# Patient Record
Sex: Male | Born: 1960 | Race: White | Hispanic: No | Marital: Single | Smoking: Never smoker
Health system: Southern US, Community
[De-identification: ages and names within clinical notes are randomized; demographics above are authoritative.]

## PROBLEM LIST (undated history)

## (undated) DIAGNOSIS — I10 Essential (primary) hypertension: Secondary | ICD-10-CM

## (undated) DIAGNOSIS — E221 Hyperprolactinemia: Secondary | ICD-10-CM

## (undated) DIAGNOSIS — G809 Cerebral palsy, unspecified: Secondary | ICD-10-CM

## (undated) DIAGNOSIS — I639 Cerebral infarction, unspecified: Secondary | ICD-10-CM

## (undated) DIAGNOSIS — F79 Unspecified intellectual disabilities: Secondary | ICD-10-CM

## (undated) HISTORY — DX: Cerebral infarction, unspecified: I63.9

## (undated) HISTORY — PX: NO PAST SURGERIES: SHX2092

## (undated) HISTORY — DX: Hyperprolactinemia: E22.1

---

## 2016-11-13 ENCOUNTER — Encounter: Payer: Self-pay | Admitting: Gastroenterology

## 2017-01-24 ENCOUNTER — Encounter: Payer: Self-pay | Admitting: Gastroenterology

## 2017-02-25 ENCOUNTER — Ambulatory Visit (HOSPITAL_COMMUNITY)
Admission: EM | Admit: 2017-02-25 | Discharge: 2017-02-25 | Disposition: A | Discharge status: Nursing Home (Includes ICF) | Payer: Medicare Other | Attending: Emergency Medicine | Admitting: Emergency Medicine

## 2017-02-25 ENCOUNTER — Emergency Department (HOSPITAL_COMMUNITY)
Admission: EM | Admit: 2017-02-25 | Discharge: 2017-02-25 | Payer: Medicare Other | Attending: Emergency Medicine | Admitting: Emergency Medicine

## 2017-02-25 ENCOUNTER — Encounter (HOSPITAL_COMMUNITY): Payer: Self-pay

## 2017-02-25 ENCOUNTER — Encounter (HOSPITAL_COMMUNITY): Payer: Self-pay | Admitting: Emergency Medicine

## 2017-02-25 DIAGNOSIS — T2125XA Burn of second degree of buttock, initial encounter: Secondary | ICD-10-CM

## 2017-02-25 DIAGNOSIS — T3141 Burns involving 40-49% of body surface with 10-19% third degree burns: Secondary | ICD-10-CM

## 2017-02-25 DIAGNOSIS — Y999 Unspecified external cause status: Secondary | ICD-10-CM | POA: Diagnosis not present

## 2017-02-25 DIAGNOSIS — T24219A Burn of second degree of unspecified thigh, initial encounter: Secondary | ICD-10-CM

## 2017-02-25 DIAGNOSIS — Y929 Unspecified place or not applicable: Secondary | ICD-10-CM | POA: Diagnosis not present

## 2017-02-25 DIAGNOSIS — Y93E1 Activity, personal bathing and showering: Secondary | ICD-10-CM | POA: Diagnosis not present

## 2017-02-25 DIAGNOSIS — X110XXA Contact with hot water in bath or tub, initial encounter: Secondary | ICD-10-CM | POA: Diagnosis not present

## 2017-02-25 DIAGNOSIS — T2121XA Burn of second degree of chest wall, initial encounter: Secondary | ICD-10-CM | POA: Diagnosis not present

## 2017-02-25 DIAGNOSIS — T2020XA Burn of second degree of head, face, and neck, unspecified site, initial encounter: Secondary | ICD-10-CM | POA: Diagnosis not present

## 2017-02-25 DIAGNOSIS — T22212A Burn of second degree of left forearm, initial encounter: Secondary | ICD-10-CM | POA: Diagnosis not present

## 2017-02-25 DIAGNOSIS — I10 Essential (primary) hypertension: Secondary | ICD-10-CM | POA: Diagnosis not present

## 2017-02-25 DIAGNOSIS — T24212A Burn of second degree of left thigh, initial encounter: Secondary | ICD-10-CM | POA: Diagnosis not present

## 2017-02-25 DIAGNOSIS — T2220XA Burn of second degree of shoulder and upper limb, except wrist and hand, unspecified site, initial encounter: Secondary | ICD-10-CM

## 2017-02-25 HISTORY — DX: Unspecified intellectual disabilities: F79

## 2017-02-25 HISTORY — DX: Essential (primary) hypertension: I10

## 2017-02-25 HISTORY — DX: Cerebral palsy, unspecified: G80.9

## 2017-02-25 LAB — BPAM RBC
Blood Product Expiration Date: 201803292359
Blood Product Expiration Date: 201803292359
ISSUE DATE / TIME: 201803061110
ISSUE DATE / TIME: 201803061110
Unit Type and Rh: 9500
Unit Type and Rh: 9500

## 2017-02-25 LAB — BPAM FFP
Blood Product Expiration Date: 201803172359
Blood Product Expiration Date: 201803242359
ISSUE DATE / TIME: 201803061111
ISSUE DATE / TIME: 201803061111
Unit Type and Rh: 6200
Unit Type and Rh: 6200

## 2017-02-25 LAB — I-STAT CHEM 8, ED
BUN: 17 mg/dL (ref 6–20)
CALCIUM ION: 1.09 mmol/L — AB (ref 1.15–1.40)
Chloride: 104 mmol/L (ref 101–111)
Creatinine, Ser: 1 mg/dL (ref 0.61–1.24)
GLUCOSE: 109 mg/dL — AB (ref 65–99)
HCT: 43 % (ref 39.0–52.0)
Hemoglobin: 14.6 g/dL (ref 13.0–17.0)
Potassium: 4.2 mmol/L (ref 3.5–5.1)
SODIUM: 138 mmol/L (ref 135–145)
TCO2: 22 mmol/L (ref 0–100)

## 2017-02-25 LAB — PREPARE FRESH FROZEN PLASMA
Unit division: 0
Unit division: 0

## 2017-02-25 LAB — I-STAT CG4 LACTIC ACID, ED: Lactic Acid, Venous: 4.51 mmol/L (ref 0.5–1.9)

## 2017-02-25 MED ORDER — HYDROMORPHONE HCL 2 MG/ML IJ SOLN
INTRAMUSCULAR | Status: AC
  Filled 2017-02-25: qty 1

## 2017-02-25 MED ORDER — ONDANSETRON HCL 4 MG/2ML IJ SOLN
INTRAMUSCULAR | Status: AC
  Filled 2017-02-25: qty 2

## 2017-02-25 MED ORDER — HYDROMORPHONE HCL 2 MG/ML IJ SOLN
1.0000 mg | Freq: Once | INTRAMUSCULAR | Status: AC
  Administered 2017-02-25: 1 mg via INTRAVENOUS

## 2017-02-25 MED ORDER — ONDANSETRON HCL 4 MG/2ML IJ SOLN
4.0000 mg | Freq: Once | INTRAMUSCULAR | Status: AC
  Administered 2017-02-25: 4 mg via INTRAVENOUS

## 2017-02-25 MED ORDER — LACTATED RINGERS IV SOLN
INTRAVENOUS | Status: DC
  Administered 2017-02-25: 12:00:00 via INTRAVENOUS

## 2017-02-25 MED ORDER — HYDROMORPHONE HCL 1 MG/ML IJ SOLN
INTRAMUSCULAR | Status: AC
  Filled 2017-02-25: qty 1

## 2017-02-25 MED ORDER — HYDROMORPHONE HCL 1 MG/ML IJ SOLN
1.0000 mg | Freq: Once | INTRAMUSCULAR | Status: AC
  Administered 2017-02-25: 1 mg via INTRAVENOUS

## 2017-02-25 NOTE — ED Notes (Signed)
[facility] advised emergency traffic response.
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
## 2017-02-25 NOTE — Discharge Instructions (Signed)
Patient transferred via [facility] to the emergency department
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
## 2017-02-25 NOTE — ED Notes (Signed)
Spoke to MD in regards to fluid resuscitation. Only to administer 125cc/hr at this time.

## 2017-02-26 DIAGNOSIS — I10 Essential (primary) hypertension: Secondary | ICD-10-CM | POA: Insufficient documentation

## 2017-02-26 DIAGNOSIS — E440 Moderate protein-calorie malnutrition: Secondary | ICD-10-CM | POA: Diagnosis present

## 2018-02-24 ENCOUNTER — Encounter: Payer: Self-pay | Admitting: Gastroenterology

## 2018-04-01 ENCOUNTER — Other Ambulatory Visit: Payer: Self-pay

## 2018-04-06 ENCOUNTER — Ambulatory Visit: Payer: Medicare Other | Admitting: Gastroenterology

## 2018-06-05 ENCOUNTER — Ambulatory Visit: Payer: Medicare Other | Admitting: Gastroenterology

## 2018-06-12 ENCOUNTER — Emergency Department (HOSPITAL_COMMUNITY): Payer: Medicare Other

## 2018-06-12 ENCOUNTER — Encounter (HOSPITAL_COMMUNITY): Payer: Self-pay | Admitting: Emergency Medicine

## 2018-06-12 ENCOUNTER — Emergency Department (HOSPITAL_COMMUNITY)
Admission: EM | Admit: 2018-06-12 | Discharge: 2018-06-12 | Disposition: A | Discharge status: Home / Self Care | Payer: Medicare Other | Attending: Emergency Medicine | Admitting: Emergency Medicine

## 2018-06-12 DIAGNOSIS — Z79899 Other long term (current) drug therapy: Secondary | ICD-10-CM | POA: Diagnosis not present

## 2018-06-12 DIAGNOSIS — F70 Mild intellectual disabilities: Secondary | ICD-10-CM | POA: Insufficient documentation

## 2018-06-12 DIAGNOSIS — R269 Unspecified abnormalities of gait and mobility: Secondary | ICD-10-CM

## 2018-06-12 DIAGNOSIS — M549 Dorsalgia, unspecified: Secondary | ICD-10-CM | POA: Diagnosis present

## 2018-06-12 DIAGNOSIS — I10 Essential (primary) hypertension: Secondary | ICD-10-CM | POA: Diagnosis not present

## 2018-06-12 DIAGNOSIS — R2689 Other abnormalities of gait and mobility: Secondary | ICD-10-CM | POA: Diagnosis not present

## 2018-06-12 LAB — URINALYSIS, ROUTINE W REFLEX MICROSCOPIC
BILIRUBIN URINE: NEGATIVE
Glucose, UA: NEGATIVE mg/dL
Hgb urine dipstick: NEGATIVE
KETONES UR: 20 mg/dL — AB
Leukocytes, UA: NEGATIVE
NITRITE: NEGATIVE
Protein, ur: NEGATIVE mg/dL
Specific Gravity, Urine: 1.028 (ref 1.005–1.030)
pH: 5 (ref 5.0–8.0)

## 2018-06-12 LAB — CBC
HCT: 41.2 % (ref 39.0–52.0)
Hemoglobin: 13.1 g/dL (ref 13.0–17.0)
MCH: 30.5 pg (ref 26.0–34.0)
MCHC: 31.8 g/dL (ref 30.0–36.0)
MCV: 96 fL (ref 78.0–100.0)
PLATELETS: 257 10*3/uL (ref 150–400)
RBC: 4.29 MIL/uL (ref 4.22–5.81)
RDW: 12.6 % (ref 11.5–15.5)
WBC: 9.6 10*3/uL (ref 4.0–10.5)

## 2018-06-12 LAB — I-STAT TROPONIN, ED: TROPONIN I, POC: 0.01 ng/mL (ref 0.00–0.08)

## 2018-06-12 LAB — BASIC METABOLIC PANEL
Anion gap: 9 (ref 5–15)
BUN: 15 mg/dL (ref 6–20)
CALCIUM: 9.2 mg/dL (ref 8.9–10.3)
CO2: 29 mmol/L (ref 22–32)
CREATININE: 1.1 mg/dL (ref 0.61–1.24)
Chloride: 108 mmol/L (ref 101–111)
Glucose, Bld: 91 mg/dL (ref 65–99)
Potassium: 3.9 mmol/L (ref 3.5–5.1)
SODIUM: 146 mmol/L — AB (ref 135–145)

## 2018-06-12 MED ORDER — SODIUM CHLORIDE 0.9 % IV BOLUS
1000.0000 mL | Freq: Once | INTRAVENOUS | Status: AC
  Administered 2018-06-12: 1000 mL via INTRAVENOUS

## 2018-06-12 MED ORDER — LORAZEPAM 2 MG/ML IJ SOLN
1.0000 mg | Freq: Once | INTRAMUSCULAR | Status: AC | PRN
  Administered 2018-06-12: 1 mg via INTRAVENOUS
  Filled 2018-06-12: qty 1

## 2018-06-12 NOTE — ED Notes (Signed)
IV access attempted without success.

## 2018-06-12 NOTE — ED Notes (Signed)
Patient transported to CT 

## 2018-06-12 NOTE — ED Notes (Signed)
Patient transported to MRI 
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
## 2018-06-12 NOTE — ED Provider Notes (Signed)
[facility] EMERGENCY DEPARTMENT Provider Note   CSN: [id number] Arrival date & time: 06/12/18  1125     History   Chief Complaint Chief Complaint  Patient presents with  . Back Pain    HPI Marvin Mahoney is a 57 y.o. male with a history of cerebral palsy, hyperprolactinemia, hypertension, and mental retardation who presents to the emergency department accompanied by his caregiver with a chief complaint of gait change.  The patient's caregiver reports that he has been caring for the patient for the last 1.5 years.  He reports that the patient normally walks upright, but notes that he has been leaning to his right side since around 8 AM this morning.  Last known normal 9 PM last night.  The patient states that he feels a little more weak and is having some difficulty sitting up straight.  He denies back pain, shoulder pain, chest pain, shortness of breath, fever, chills, nausea, vomiting, diarrhea, dizziness, slurred speech, visual changes, headache, or worse than baseline numbness at this time.  The patient's right arm is contracted at baseline.  The patient is ambulatory at baseline.  The history is provided by the patient and a caregiver. No language interpreter was used.    Past Medical History:  Diagnosis Date  . Cerebral palsy ([place])   . Hyperprolactinemia ([place])   . Hypertension   . Mental retardation     There are no active problems to display for this patient.   No past surgical history on file.      Home Medications    Prior to Admission medications   Medication Sig Start Date End Date Taking? Authorizing Provider  PARoxetine (PAXIL) 20 MG tablet Take 60 mg by mouth at bedtime.    Yes [provider]  risperiDONE (RISPERDAL M-TABS) 2 MG disintegrating tablet Take 2 mg by mouth at bedtime.   Yes [provider]    Family History No family history on file.  Social History Social History   Tobacco Use  . Smoking status:  Unknown If Ever Smoked  Substance Use Topics  . Alcohol use: No  . Drug use: Not on file     Allergies   Patient has no known allergies.   Review of Systems Review of Systems  Constitutional: Negative for appetite change and fever.  HENT: Negative for congestion.   Respiratory: Negative for shortness of breath.   Cardiovascular: Negative for chest pain and palpitations.  Gastrointestinal: Negative for abdominal pain, diarrhea, nausea and vomiting.  Genitourinary: Negative for dysuria, hematuria and urgency.  Musculoskeletal: Positive for gait problem. Negative for back pain.  Skin: Negative for rash.  Allergic/Immunologic: Negative for immunocompromised state.  Neurological: Positive for weakness. Negative for dizziness, seizures, syncope, facial asymmetry, speech difficulty, light-headedness, numbness and headaches.  Psychiatric/Behavioral: Negative for confusion.   Physical Exam Updated Vital Signs BP 125/88   Pulse 71   Temp 98.3 F (36.8 C) (Oral)   Resp (!) 23   SpO2 98%   Physical Exam  Constitutional: He appears well-developed.  HENT:  Head: Normocephalic.  Eyes: Pupils are equal, round, and reactive to light. Conjunctivae and EOM are normal. No scleral icterus.  Neck: Neck supple.  Cardiovascular: Normal rate, regular rhythm, normal heart sounds and intact distal pulses. Exam reveals no gallop and no friction rub.  No murmur heard. Pulmonary/Chest: Effort normal. No stridor. No respiratory distress. He has no wheezes. He has no rales. He exhibits no tenderness.  Abdominal: Soft. He exhibits no  distension and no mass. There is no tenderness. There is no rebound and no guarding. No hernia.  Musculoskeletal: Normal range of motion. He exhibits no edema, tenderness or deformity.  Neurological: He is alert.  Cranial nerves II through XII are grossly intact.  Finger-to-nose is deferred at this time as the patient's right upper extremity is contracted and weak at  baseline.  He has minimal to no use of the right arm.  Good strength against resistance of the left arm.  Sensation is intact throughout.  Good strength against resistance of the bilateral lower extremities; however, strength appears to be 4 out of 5 against resistance on the right side, which the patient's caregiver reports is baseline.  Patient is ambulatory, but has a shuffling, non-symmetric gait.  The caregiver also reports that the patient's gait seems baseline.  However, as the patient is ambulating he is clearly leaning to his right side.  He is able to straighten his spine up the right, but quickly returns back to the right sided lean.  The patient's caregiver reports that this is new from baseline.  Skin: Skin is warm and dry.  Psychiatric: His behavior is normal.  Nursing note and vitals reviewed.    ED Treatments / Results  Labs (all labs ordered are listed, but only abnormal results are displayed) Labs Reviewed  BASIC METABOLIC PANEL - Abnormal; Notable for the following components:      Result Value   Sodium 146 (*)    All other components within normal limits  URINALYSIS, ROUTINE W REFLEX MICROSCOPIC - Abnormal; Notable for the following components:   Ketones, ur 20 (*)    All other components within normal limits  CBC  I-STAT TROPONIN, ED    EKG EKG Interpretation  Date/Time:  Friday June 12 2018 11:51:43 EDT Ventricular Rate:  95 PR Interval:  128 QRS Duration: 84 QT Interval:  346 QTC Calculation: 434 R Axis:   70 Text Interpretation:  Sinus rhythm with Premature supraventricular complexes Otherwise normal ECG Confirmed by Dene Gentry [phone]) on 06/12/2018 3:45:10 PM   Radiology Dg Chest 2 View  Result Date: 06/12/2018 CLINICAL DATA:  Pt arrives with caregiver, pt has hx of cerebral palsy, the caregiver states he was walking with a lean to the right today, states yesterday he was walking normally. Recent back pain. EXAM: CHEST - 2 VIEW COMPARISON:  Thoracic  spine 06/12/2018 FINDINGS: Heart size is normal. Patient is slightly rotated. The chin obscures the RIGHT lung apex. There are no focal consolidations or pleural effusions. No pulmonary edema. IMPRESSION: No evidence for acute  abnormality. Electronically Signed   By: Nolon Nations M.D.   On: 06/12/2018 12:42   Dg Thoracic Spine 2 View  Result Date: 06/12/2018 CLINICAL DATA:  Pt arrives with caregiver, pt has hx of Cerapall palsy, the caregiver states he was walking with a lean to the right today, states yesterday he was walking normally. Recent back pain. EXAM: THORACIC SPINE 2 VIEWS COMPARISON:  None. FINDINGS: There is normal alignment of the thoracic spine changes are present. No definite fractures. No lytic or blastic lesions. Posterior ribs are unremarkable. Moderate degenerative changes are seen in the LOWER thoracic levels. IMPRESSION: No evidence for acute  abnormality.  Mild degenerative changes. Electronically Signed   By: Nolon Nations M.D.   On: 06/12/2018 12:44   Dg Lumbar Spine Complete  Result Date: 06/12/2018 CLINICAL DATA:  Pt arrives with caregiver, pt has hx of Cerapall palsy, the caregiver states he was  walking with a lean to the right today, states yesterday he was walking normally. Recent back pain. EXAM: LUMBAR SPINE - COMPLETE 4+ VIEW COMPARISON:  None. FINDINGS: There is scoliosis convex to the LEFT. Associated degenerative changes are identified particularly at L2-3 and L3-4. Disc height loss noted at the same levels. There is no acute fracture or subluxation. Significant stool burden. IMPRESSION: No evidence for acute abnormality. Degenerative changes. Significant stool burden. Electronically Signed   By: Nolon Nations M.D.   On: 06/12/2018 12:45   Ct Head Wo Contrast  Result Date: 06/12/2018 CLINICAL DATA:  History of cerebral palsy.  Ataxia. EXAM: CT HEAD WITHOUT CONTRAST TECHNIQUE: Contiguous axial images were obtained from the base of the skull through the vertex  without intravenous contrast. COMPARISON:  None. FINDINGS: Brain: Large area of encephalomalacia is seen involving the left frontal, parietal and temporal lobes consistent with old infarction or other chronic injury. No mass effect or midline shift is noted. Ventricular size is within normal limits. There is no evidence of mass lesion, hemorrhage or acute infarction. Vascular: No hyperdense vessel or unexpected calcification. Skull: Normal. Negative for fracture or focal lesion. Sinuses/Orbits: No acute finding. Other: None. IMPRESSION: Large area of encephalomalacia involving the left cerebral cortex consistent with old infarction. No acute intracranial abnormality seen. Electronically Signed   By: Marijo Conception, M.D.   On: 06/12/2018 16:25   Mr Brain Wo Contrast  Result Date: 06/12/2018 CLINICAL DATA:  Abnormality, leaning toward the RIGHT. History of mental retardation, hypertension, hyperlipidemia. EXAM: MRI HEAD WITHOUT CONTRAST TECHNIQUE: Multiplanar, multiecho pulse sequences of the brain and surrounding structures were obtained without intravenous contrast. COMPARISON:  CT HEAD June 12, 2018. FINDINGS: INTRACRANIAL CONTENTS: No reduced diffusion to suggest acute ischemia. 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 Ex vacuo dilatation LEFT lateral ventricle. Moderate general parenchymal brain volume loss. Small corpus callosum is likely atrophic, less likely dysgenesis. Prominent LEFT holo hemispheric T2 bright extra-axial collections. Mildly smaller LEFT occipital lobe and LEFT cerebellum, likely developmental. Generally smaller LEFT calvarium and, smaller LEFT posterior fossa. Severe LEFT Wallerian degeneration with small LEFT cerebral peduncle and flattened LEFT medulla. A few punctate supratentorial white matter FLAIR T2 hyperintensities compatible chronic small vessel ischemic changes. No midline shift, mass effect or  masses. VASCULAR: Normal major intracranial vascular flow voids present at skull base. SKULL AND UPPER CERVICAL SPINE: No abnormal sellar expansion. No suspicious calvarial bone marrow signal. Thickened LEFT craniocervical junction maintained. SINUSES/ORBITS: The mastoid air-cells and included paranasal sinuses are well-aerated.The included ocular globes and orbital contents are non-suspicious. OTHER: None. IMPRESSION: 1. No acute intracranial process. 2. 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 3. 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 4. 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 Electronically Signed   By: Elon Alas M.D.   On: 06/12/2018 20:21    Procedures Procedures (including critical care time)  Medications Ordered in ED Medications  sodium chloride 0.9 % bolus 1,000 mL (0 mLs Intravenous Stopped 06/12/18 1949)  LORazepam (ATIVAN) injection 1 mg (1 mg Intravenous Given 06/12/18 1806)     Initial Impression / Assessment and Plan / ED Course  I have reviewed the triage vital signs and the nursing notes.  Pertinent labs & imaging results that were available during my care of the patient were reviewed by me and considered in my medical decision making (see chart  for details).     57 year old male with a history of cerebral palsy, hyperprolactinemia, hypertension, and mental retardation who presents to the emergency department accompanied by his caregiver with a chief complaint of gait change; the patient has been ambulating, but has been leaning to the right side. Last known normal 9 PM last night.  X-rays of the thoracic and lumbar spine and chest x-ray ordered by triage are unremarkable.  Minimal hypernatremia at 146.  Urinalysis is pending.  Given concern for possible new weakness, CT head was ordered which showed a  large area of encephalomalacia involving the left cerebral cortex consistent with old infarction.  No acute intracranial abnormality seen.  Patient was discussed with Dr. Francia Greaves, attending physician.  Consulted neurology and spoke with Dr. Lorraine Lax who recommended performing a UTI, giving the patient an IV fluid bolus.  He also stated that an MRI brain could be checked.  He felt that if all of these tests were normal that the patient can be discharged home.  Urinalysis with mild ketonuria, but not concerning for infection.  He was given IV fluid bolus.  MRI brain with severe left cerebrum encephalomalacia with severely wallerian degeneration and a left holohemispheric prominent extra-axial CSF space, possible arachnoid cyst and in part due to encephalomalacia.  Corpus callosum is atrophic.  Spoke with Dr. Cheral Marker, the p.m. neurologist, who reviewed the images.  No acute intracranial process.  I have spoken with the patient's caregiver who is in agreement that the patient is safe for discharge home.  I spoke with the patient's legal guardian, Barnie Alderman, earlier regarding the patient's care.   Strict return precautions given.  The patient is hemodynamically stable and in no acute distress.  He is safe for discharge home at this time.  Final Clinical Impressions(s) / ED Diagnoses   Final diagnoses:  Gait abnormality    ED Discharge Orders    None       Joanne Gavel, PA-C 06/12/18 2344    Valarie Merino, MD 06/14/18 [phone]

## 2018-06-12 NOTE — ED Notes (Signed)
MRI called to advise they were coming to transport

## 2018-06-12 NOTE — ED Triage Notes (Addendum)
Pt arrives with caregiver, pt has hx of Cerapall palsy, the caregiver states he was walking with a lean to the right today, states yesterday he was walking normally. Pt caregiver states "he says his back has been hurting him and his chest" but caregiver states it is difficult to know what's going on because he will sometimes say he is hurting everywhere. No leg drift on assessment. Unable to assess right arm. Pt has pain to palpation to his right upper back. No tenderness to abdomen or lower back on assessment.

## 2018-06-12 NOTE — ED Notes (Signed)
Caretaker: Rutherford Nail [phone]

## 2018-06-12 NOTE — Discharge Instructions (Addendum)
Thank you for allowing me to provide your care today in the emergency department.  Your MRI did not show any signs of a stroke.  The rest of your blood work is reassuring.  If you have pain in the back, you can take 650 mg of Tylenol every 6 hours for pain control.   Return to the emergency department if you become unable to walk, have recurrent falls, visual changes, slurred speech, or other new, concerning symptoms.

## 2018-06-12 NOTE — ED Notes (Signed)
IV team at bedside 

## 2018-08-11 ENCOUNTER — Encounter: Payer: Self-pay | Admitting: Gastroenterology

## 2018-08-11 ENCOUNTER — Telehealth: Payer: Self-pay | Admitting: Gastroenterology

## 2018-08-11 ENCOUNTER — Ambulatory Visit (INDEPENDENT_AMBULATORY_CARE_PROVIDER_SITE_OTHER): Payer: Medicare Other | Admitting: Gastroenterology

## 2018-08-11 VITALS — BP 100/68 | HR 88 | Ht 72.0 in | Wt 173.5 lb

## 2018-08-11 DIAGNOSIS — R195 Other fecal abnormalities: Secondary | ICD-10-CM | POA: Diagnosis not present

## 2018-08-11 DIAGNOSIS — K625 Hemorrhage of anus and rectum: Secondary | ICD-10-CM | POA: Diagnosis not present

## 2018-08-11 DIAGNOSIS — K921 Melena: Secondary | ICD-10-CM

## 2018-08-11 MED ORDER — PEG 3350-KCL-NA BICARB-NACL 420 G PO SOLR: 4000.0000 mL | ORAL | 0 refills | Status: DC

## 2018-08-11 NOTE — Telephone Encounter (Signed)
Spoke to [place] from [facility]. We have set up a phone conference on 09/24/18 at 8:30am for a verbal signature consent for 10/13/18 colonoscopy.

## 2018-08-11 NOTE — Progress Notes (Signed)
HPI: This is a very pleasant 57 year old man who was referred to me by Lillia Corporal MD, Volney Presser F-NP at [facility] complaint is fecal occult blood test positive, intermittent minor rectal bleeding  Mr. Laham is a 86 and he has cerebral palsy.  He answers some questions seemingly appropriately today but most of his history is from his long-term caregiver whom he lives with.  Also here in the room today is a representative from [place].  Care giver from is facility tells me that he was FOBT +, also overt blood in his stool very rarely.  stabel weiht.  He has occasional incontinence of stool  No pains.  FH unknown  Old Data Reviewed:    Review of systems: Pertinent positive and negative review of systems were noted in the above HPI section. All other review negative.   Past Medical History:  Diagnosis Date  . Cerebral palsy ([place])   . Hyperprolactinemia ([place])   . Hypertension   . Mental retardation   . Stroke [facility])     Past Surgical History:  Procedure Laterality Date  . NO PAST SURGERIES     unknown    Current Outpatient Medications  Medication Sig Dispense Refill  . PARoxetine (PAXIL) 20 MG tablet Take 60 mg by mouth at bedtime.     . risperiDONE (RISPERDAL M-TABS) 2 MG disintegrating tablet Take 2 mg by mouth at bedtime.     No current facility-administered medications for this visit.     Allergies as of 08/11/2018  . (No Known Allergies)    Family History  Family history unknown: Yes    Social History   Socioeconomic History  . Marital status: Single    Spouse name: Not on file  . Number of children: 0  . Years of education: Not on file  . Highest education level: Not on file  Occupational History  . Occupation: disabled  Social Needs  . Financial resource strain: Not on file  . Food insecurity:    Worry: Not on file    Inability: Not on file  . Transportation needs:    Medical: Not on file    Non-medical: Not on file  Tobacco  Use  . Smoking status: Unknown If Ever Smoked  . Smokeless tobacco: Never Used  Substance and Sexual Activity  . Alcohol use: No  . Drug use: Not on file  . Sexual activity: Not on file  Lifestyle  . Physical activity:    Days per week: Not on file    Minutes per session: Not on file  . Stress: Not on file  Relationships  . Social connections:    Talks on phone: Not on file    Gets together: Not on file    Attends religious service: Not on file    Active member of club or organization: Not on file    Attends meetings of clubs or organizations: Not on file    Relationship status: Not on file  . Intimate partner violence:    Fear of current or ex partner: Not on file    Emotionally abused: Not on file    Physically abused: Not on file    Forced sexual activity: Not on file  Other Topics Concern  . Not on file  Social History Narrative   ** Merged History Encounter **         Physical Exam: Ht 6' (1.829 m)   Wt 173 lb 8 oz (78.7 kg)   BMI  23.53 kg/m  Constitutional: Thin, right arm contracted Psychiatric: alert and oriented x1? Eyes: extraocular movements intact Mouth: oral pharynx moist, no lesions Neck: supple no lymphadenopathy Cardiovascular: heart regular rate and rhythm Lungs: clear to auscultation bilaterally Abdomen: soft, nontender, nondistended, no obvious ascites, no peritoneal signs, normal bowel sounds Extremities: no lower extremity edema bilaterally Skin: no lesions on visible extremities   Assessment and plan: 57 y.o. male with cerebral palsy, mental retardation, fecal occult blood test positive stool, minor intermittent rectal bleeding  We discussed colonoscopy to evaluate the above symptoms.  His long-term caregiver will be instructed on prep instructions.  The representative from [place] is unable to sign consent for invasive testing but his boss will be able to give consent over the phone when needed.  We may be able to get that done today.  I  recommended Citrucel fiber supplements on a daily basis to help with what sounds like minor alternating bowel habits.   Please see the "Patient Instructions" section for addition details about the plan.   Owens Loffler, MD [facility] 08/11/2018, 10:38 AM  Cc: No ref. provider found

## 2018-08-11 NOTE — Telephone Encounter (Signed)
Sherrill from [facility] states she is calling [facility] back. Best contact is [phone].

## 2018-08-11 NOTE — Patient Instructions (Addendum)
You will be set up for a colonoscopy for FOBT + stool.  Please start taking citrucel (orange flavored) powder fiber supplement.  This may cause some bloating at first but that usually goes away. Begin with a small spoonful and work your way up to a large, heaping spoonful daily over a week.  Normal BMI (Body Mass Index- based on height and weight) is between 19 and 25. Your BMI today is Body mass index is 23.53 kg/m. Marland Kitchen Please consider follow up  regarding your BMI with your Primary Care Provider.

## 2018-09-24 ENCOUNTER — Telehealth: Payer: Self-pay | Admitting: Gastroenterology

## 2018-09-24 NOTE — Telephone Encounter (Signed)
I notified Sherrill [place] Social Worker/Leagal guardian  for patient. She gave me her verbal signature for the patient as he is unable to sign for consent on 10/13/18 colonoscopy with Dr Ardis Hughs in [facility]. Benay Spice will contact Barnie Alderman who is patients case manager to make sure patients care partner still has the instructions and colon prep. Joe will contact me for any questions regarding the procedure.

## 2018-10-13 ENCOUNTER — Ambulatory Visit (AMBULATORY_SURGERY_CENTER): Payer: Medicare Other | Admitting: Gastroenterology

## 2018-10-13 ENCOUNTER — Encounter: Payer: Self-pay | Admitting: Gastroenterology

## 2018-10-13 VITALS — BP 133/75 | HR 48 | Temp 97.8°F | Resp 22 | Ht 72.0 in | Wt 173.0 lb

## 2018-10-13 DIAGNOSIS — Z1211 Encounter for screening for malignant neoplasm of colon: Secondary | ICD-10-CM | POA: Diagnosis not present

## 2018-10-13 DIAGNOSIS — D122 Benign neoplasm of ascending colon: Secondary | ICD-10-CM

## 2018-10-13 DIAGNOSIS — K921 Melena: Secondary | ICD-10-CM

## 2018-10-13 MED ORDER — SODIUM CHLORIDE 0.9 % IV SOLN: 500.0000 mL | Freq: Once | INTRAVENOUS | Status: DC

## 2018-10-13 NOTE — Op Note (Signed)
[facility] Patient Name: Marvin Mahoney Procedure Date: 10/13/2018 1:23 PM MRN: [id number] Endoscopist: Milus Banister , MD Age: 57 Referring MD:  Date of Birth: [birth year] Gender: Male Account #: [id number] Procedure:                Colonoscopy Indications:              Screening for colorectal malignant neoplasm Medicines:                Monitored Anesthesia Care Procedure:                Pre-Anesthesia Assessment:                           - Prior to the procedure, a History and Physical                            was performed, and patient medications and                            allergies were reviewed. The patient's tolerance of                            previous anesthesia was also reviewed. The risks                            and benefits of the procedure and the sedation                            options and risks were discussed with the patient.                            All questions were answered, and informed consent                            was obtained. Prior Anticoagulants: The patient has                            taken no previous anticoagulant or antiplatelet                            agents. ASA Grade Assessment: II - A patient with                            mild systemic disease. After reviewing the risks                            and benefits, the patient was deemed in                            satisfactory condition to undergo the procedure.                           After obtaining informed consent, the colonoscope  was passed under direct vision. Throughout the                            procedure, the patient's blood pressure, pulse, and                            oxygen saturations were monitored continuously. The                            Model CF-HQ190L [id number]) scope was introduced                            through the anus and advanced to the the cecum,                            identified by  appendiceal orifice and ileocecal                            valve. The colonoscopy was performed without                            difficulty. The patient tolerated the procedure                            well. The quality of the bowel preparation was                            good. The ileocecal valve, appendiceal orifice, and                            rectum were photographed. Scope In: 1:44:57 PM Scope Out: 2:01:53 PM Scope Withdrawal Time: 0 hours 12 minutes 20 seconds  Total Procedure Duration: 0 hours 16 minutes 56 seconds  Findings:                 Two sessile polyps were found in the rectum and                            ascending colon. The polyps were 2 to 3 mm in size.                            These polyps were removed with a cold snare.                            Resection and retrieval were partial.                           Many small and large-mouthed diverticula were found                            in the left colon.                           The exam was otherwise without abnormality on  direct and retroflexion views. Complications:            No immediate complications. Estimated blood loss:                            None. Estimated Blood Loss:     Estimated blood loss: none. Impression:               - Two 2 to 3 mm polyps in the rectum and in the                            ascending colon, removed with a cold snare.                            Resected and retrieved.                           - Diverticulosis in the left colon.                           - The examination was otherwise normal on direct                            and retroflexion views. Recommendation:           - Patient has a contact number available for                            emergencies. The signs and symptoms of potential                            delayed complications were discussed with the                            patient. Return to normal activities  tomorrow.                            Written discharge instructions were provided to the                            patient.                           - Resume previous diet.                           - Continue present medications.                           You will receive a letter within 2-3 weeks with the                            pathology results and my final recommendations.                           If the polyp(s) is proven to be 'pre-cancerous' on  pathology, you will need repeat colonoscopy in 5                            years. If the polyp(s) is NOT 'precancerous' on                            pathology then you should repeat colon cancer                            screening in 10 years with colonoscopy without need                            for colon cancer screening by any method prior to                            then (including stool testing). Milus Banister, MD 10/13/2018 2:06:21 PM This report has been signed electronically.

## 2018-10-13 NOTE — Patient Instructions (Signed)
Continue present medications. Please read handouts provided on Polyps and Diverticulosis.    YOU HAD AN ENDOSCOPIC PROCEDURE TODAY AT [facility]:   Refer to the procedure report that was given to you for any specific questions about what was found during the examination.  If the procedure report does not answer your questions, please call your gastroenterologist to clarify.  If you requested that your care partner not be given the details of your procedure findings, then the procedure report has been included in a sealed envelope for you to review at your convenience later.  YOU SHOULD EXPECT: Some feelings of bloating in the abdomen. Passage of more gas than usual.  Walking can help get rid of the air that was put into your GI tract during the procedure and reduce the bloating. If you had a lower endoscopy (such as a colonoscopy or flexible sigmoidoscopy) you may notice spotting of blood in your stool or on the toilet paper. If you underwent a bowel prep for your procedure, you may not have a normal bowel movement for a few days.  Please Note:  You might notice some irritation and congestion in your nose or some drainage.  This is from the oxygen used during your procedure.  There is no need for concern and it should clear up in a day or so.  SYMPTOMS TO REPORT IMMEDIATELY:   Following lower endoscopy (colonoscopy or flexible sigmoidoscopy):  Excessive amounts of blood in the stool  Significant tenderness or worsening of abdominal pains  Swelling of the abdomen that is new, acute  Fever of 100F or higher    For urgent or emergent issues, a gastroenterologist can be reached at any hour by calling [phone].   DIET:  We do recommend a small meal at first, but then you may proceed to your regular diet.  Drink plenty of fluids but you should avoid alcoholic beverages for 24 hours.  ACTIVITY:  You should plan to take it easy for the rest of today and you should NOT  DRIVE or use heavy machinery until tomorrow (because of the sedation medicines used during the test).    FOLLOW UP: Our staff will call the number listed on your records the next business day following your procedure to check on you and address any questions or concerns that you may have regarding the information given to you following your procedure. If we do not reach you, we will leave a message.  However, if you are feeling well and you are not experiencing any problems, there is no need to return our call.  We will assume that you have returned to your regular daily activities without incident.  If any biopsies were taken you will be contacted by phone or by letter within the next 1-3 weeks.  Please call us at [phone] if you have not heard about the biopsies in 3 weeks.    SIGNATURES/CONFIDENTIALITY: You and/or your care partner have signed paperwork which will be entered into your electronic medical record.  These signatures attest to the fact that that the information above on your After Visit Summary has been reviewed and is understood.  Full responsibility of the confidentiality of this discharge information lies with you and/or your care-partner.

## 2018-10-13 NOTE — Progress Notes (Signed)
Please call if any questions or concerns.Called to room to assist during endoscopic procedure.  Patient ID and intended procedure confirmed with present staff. Received instructions for my participation in the procedure from the performing physician. 

## 2018-10-13 NOTE — Progress Notes (Signed)
Spontaneous respirations throughout. VSS. Resting comfortably. To PACU on room air. Report to  RN. 

## 2018-10-14 ENCOUNTER — Telehealth: Payer: Self-pay

## 2018-10-14 NOTE — Telephone Encounter (Signed)
  Follow up Call-  Call back number 10/13/2018  Post procedure Call Back phone  # [phone]  Permission to leave phone message Yes  Some recent data might be hidden     Patient questions:  Do you have a fever, pain , or abdominal swelling? No. Pain Score  0 *  Have you tolerated food without any problems? Yes.    Have you been able to return to your normal activities? Yes.    Do you have any questions about your discharge instructions: Diet   No. Medications  No. Follow up visit  No.  Do you have questions or concerns about your Care? No.  Actions: * If pain score is 4 or above: No action needed, pain <4.

## 2018-10-18 ENCOUNTER — Encounter: Payer: Self-pay | Admitting: Gastroenterology

## 2020-09-20 ENCOUNTER — Other Ambulatory Visit: Payer: Medicare Other

## 2020-09-20 DIAGNOSIS — Z20822 Contact with and (suspected) exposure to covid-19: Secondary | ICD-10-CM

## 2020-09-21 LAB — SARS-COV-2, NAA 2 DAY TAT

## 2020-09-21 LAB — NOVEL CORONAVIRUS, NAA: SARS-CoV-2, NAA: NOT DETECTED

## 2021-03-08 DIAGNOSIS — F0390 Unspecified dementia without behavioral disturbance: Secondary | ICD-10-CM | POA: Diagnosis present

## 2021-07-13 DIAGNOSIS — F4321 Adjustment disorder with depressed mood: Secondary | ICD-10-CM | POA: Insufficient documentation

## 2021-07-13 DIAGNOSIS — G4700 Insomnia, unspecified: Secondary | ICD-10-CM | POA: Insufficient documentation

## 2021-07-13 DIAGNOSIS — F411 Generalized anxiety disorder: Secondary | ICD-10-CM | POA: Insufficient documentation

## 2021-07-28 ENCOUNTER — Observation Stay (HOSPITAL_COMMUNITY): Payer: Medicare Other

## 2021-07-28 ENCOUNTER — Emergency Department (HOSPITAL_COMMUNITY): Payer: Medicare Other

## 2021-07-28 ENCOUNTER — Inpatient Hospital Stay (HOSPITAL_COMMUNITY)
Admission: EM | Admit: 2021-07-28 | Discharge: 2021-08-04 | DRG: 522 | Disposition: A | Discharge status: Skilled Nursing Facility | Payer: Medicare Other | Attending: Student in an Organized Health Care Education/Training Program | Admitting: Student in an Organized Health Care Education/Training Program

## 2021-07-28 ENCOUNTER — Encounter (HOSPITAL_COMMUNITY): Payer: Self-pay | Admitting: Emergency Medicine

## 2021-07-28 DIAGNOSIS — W1830XA Fall on same level, unspecified, initial encounter: Secondary | ICD-10-CM | POA: Diagnosis present

## 2021-07-28 DIAGNOSIS — Z20822 Contact with and (suspected) exposure to covid-19: Secondary | ICD-10-CM | POA: Diagnosis present

## 2021-07-28 DIAGNOSIS — S72009A Fracture of unspecified part of neck of unspecified femur, initial encounter for closed fracture: Secondary | ICD-10-CM | POA: Diagnosis present

## 2021-07-28 DIAGNOSIS — M810 Age-related osteoporosis without current pathological fracture: Secondary | ICD-10-CM | POA: Diagnosis present

## 2021-07-28 DIAGNOSIS — S72141A Displaced intertrochanteric fracture of right femur, initial encounter for closed fracture: Secondary | ICD-10-CM | POA: Diagnosis not present

## 2021-07-28 DIAGNOSIS — W19XXXA Unspecified fall, initial encounter: Secondary | ICD-10-CM

## 2021-07-28 DIAGNOSIS — I10 Essential (primary) hypertension: Secondary | ICD-10-CM | POA: Diagnosis present

## 2021-07-28 DIAGNOSIS — Z23 Encounter for immunization: Secondary | ICD-10-CM

## 2021-07-28 DIAGNOSIS — Z419 Encounter for procedure for purposes other than remedying health state, unspecified: Secondary | ICD-10-CM

## 2021-07-28 DIAGNOSIS — F79 Unspecified intellectual disabilities: Secondary | ICD-10-CM | POA: Diagnosis present

## 2021-07-28 DIAGNOSIS — F0390 Unspecified dementia without behavioral disturbance: Secondary | ICD-10-CM | POA: Diagnosis present

## 2021-07-28 DIAGNOSIS — M79604 Pain in right leg: Secondary | ICD-10-CM

## 2021-07-28 DIAGNOSIS — Z87891 Personal history of nicotine dependence: Secondary | ICD-10-CM

## 2021-07-28 DIAGNOSIS — Z79899 Other long term (current) drug therapy: Secondary | ICD-10-CM

## 2021-07-28 DIAGNOSIS — F32A Depression, unspecified: Secondary | ICD-10-CM | POA: Diagnosis present

## 2021-07-28 DIAGNOSIS — S72001A Fracture of unspecified part of neck of right femur, initial encounter for closed fracture: Secondary | ICD-10-CM

## 2021-07-28 DIAGNOSIS — M25561 Pain in right knee: Secondary | ICD-10-CM | POA: Diagnosis present

## 2021-07-28 DIAGNOSIS — R2990 Unspecified symptoms and signs involving the nervous system: Secondary | ICD-10-CM

## 2021-07-28 DIAGNOSIS — I69351 Hemiplegia and hemiparesis following cerebral infarction affecting right dominant side: Secondary | ICD-10-CM

## 2021-07-28 DIAGNOSIS — R531 Weakness: Secondary | ICD-10-CM

## 2021-07-28 DIAGNOSIS — E221 Hyperprolactinemia: Secondary | ICD-10-CM | POA: Diagnosis present

## 2021-07-28 DIAGNOSIS — R29810 Facial weakness: Secondary | ICD-10-CM | POA: Diagnosis present

## 2021-07-28 DIAGNOSIS — Z6825 Body mass index (BMI) 25.0-25.9, adult: Secondary | ICD-10-CM

## 2021-07-28 DIAGNOSIS — E440 Moderate protein-calorie malnutrition: Secondary | ICD-10-CM | POA: Diagnosis present

## 2021-07-28 DIAGNOSIS — G809 Cerebral palsy, unspecified: Secondary | ICD-10-CM | POA: Diagnosis present

## 2021-07-28 DIAGNOSIS — M47812 Spondylosis without myelopathy or radiculopathy, cervical region: Secondary | ICD-10-CM | POA: Diagnosis present

## 2021-07-28 LAB — CBC WITH DIFFERENTIAL/PLATELET
Abs Immature Granulocytes: 0.04 10*3/uL (ref 0.00–0.07)
Basophils Absolute: 0 10*3/uL (ref 0.0–0.1)
Basophils Relative: 0 %
Eosinophils Absolute: 0 10*3/uL (ref 0.0–0.5)
Eosinophils Relative: 0 %
HCT: 39.2 % (ref 39.0–52.0)
Hemoglobin: 12.9 g/dL — ABNORMAL LOW (ref 13.0–17.0)
Immature Granulocytes: 0 %
Lymphocytes Relative: 4 %
Lymphs Abs: 0.4 10*3/uL — ABNORMAL LOW (ref 0.7–4.0)
MCH: 31.1 pg (ref 26.0–34.0)
MCHC: 32.9 g/dL (ref 30.0–36.0)
MCV: 94.5 fL (ref 80.0–100.0)
Monocytes Absolute: 0.9 10*3/uL (ref 0.1–1.0)
Monocytes Relative: 9 %
Neutro Abs: 8.8 10*3/uL — ABNORMAL HIGH (ref 1.7–7.7)
Neutrophils Relative %: 87 %
Platelets: 187 10*3/uL (ref 150–400)
RBC: 4.15 MIL/uL — ABNORMAL LOW (ref 4.22–5.81)
RDW: 12.8 % (ref 11.5–15.5)
WBC: 10.1 10*3/uL (ref 4.0–10.5)
nRBC: 0 % (ref 0.0–0.2)

## 2021-07-28 LAB — I-STAT CHEM 8, ED
BUN: 13 mg/dL (ref 6–20)
Calcium, Ion: 1.03 mmol/L — ABNORMAL LOW (ref 1.15–1.40)
Chloride: 108 mmol/L (ref 98–111)
Creatinine, Ser: 0.8 mg/dL (ref 0.61–1.24)
Glucose, Bld: 190 mg/dL — ABNORMAL HIGH (ref 70–99)
HCT: 38 % — ABNORMAL LOW (ref 39.0–52.0)
Hemoglobin: 12.9 g/dL — ABNORMAL LOW (ref 13.0–17.0)
Potassium: 3.5 mmol/L (ref 3.5–5.1)
Sodium: 141 mmol/L (ref 135–145)
TCO2: 23 mmol/L (ref 22–32)

## 2021-07-28 LAB — COMPREHENSIVE METABOLIC PANEL
ALT: 14 U/L (ref 0–44)
AST: 23 U/L (ref 15–41)
Albumin: 3.4 g/dL — ABNORMAL LOW (ref 3.5–5.0)
Alkaline Phosphatase: 64 U/L (ref 38–126)
Anion gap: 8 (ref 5–15)
BUN: 13 mg/dL (ref 6–20)
CO2: 23 mmol/L (ref 22–32)
Calcium: 8.7 mg/dL — ABNORMAL LOW (ref 8.9–10.3)
Chloride: 109 mmol/L (ref 98–111)
Creatinine, Ser: 0.92 mg/dL (ref 0.61–1.24)
GFR, Estimated: >60 mL/min (ref >60)
Glucose, Bld: 188 mg/dL — ABNORMAL HIGH (ref 70–99)
Potassium: 3.5 mmol/L (ref 3.5–5.1)
Sodium: 140 mmol/L (ref 135–145)
Total Bilirubin: 1 mg/dL (ref 0.3–1.2)
Total Protein: 6.2 g/dL — ABNORMAL LOW (ref 6.5–8.1)

## 2021-07-28 LAB — CBG MONITORING, ED: Glucose-Capillary: 171 mg/dL — ABNORMAL HIGH (ref 70–99)

## 2021-07-28 LAB — PROTIME-INR
INR: 1.2 (ref 0.8–1.2)
Prothrombin Time: 14.8 seconds (ref 11.4–15.2)

## 2021-07-28 LAB — APTT: aPTT: 26 seconds (ref 24–36)

## 2021-07-28 MED ORDER — PAROXETINE HCL 30 MG PO TABS
60.0000 mg | ORAL_TABLET | Freq: Every day | ORAL | Status: DC
  Administered 2021-07-28 – 2021-08-03 (×7): 60 mg via ORAL
  Filled 2021-07-28 (×8): qty 2

## 2021-07-28 MED ORDER — SENNOSIDES-DOCUSATE SODIUM 8.6-50 MG PO TABS: 1.0000 | ORAL_TABLET | Freq: Every evening | ORAL | Status: DC | PRN

## 2021-07-28 MED ORDER — SODIUM CHLORIDE 0.9% FLUSH
3.0000 mL | Freq: Once | INTRAVENOUS | Status: AC
  Administered 2021-07-28: 3 mL via INTRAVENOUS

## 2021-07-28 MED ORDER — ASPIRIN EC 81 MG PO TBEC
81.0000 mg | DELAYED_RELEASE_TABLET | Freq: Every day | ORAL | Status: DC
  Administered 2021-07-28 – 2021-08-02 (×5): 81 mg via ORAL
  Filled 2021-07-28 (×5): qty 1

## 2021-07-28 MED ORDER — ACETAMINOPHEN 650 MG RE SUPP: 650.0000 mg | Freq: Four times a day (QID) | RECTAL | Status: DC | PRN

## 2021-07-28 MED ORDER — ACETAMINOPHEN 325 MG PO TABS
650.0000 mg | ORAL_TABLET | Freq: Four times a day (QID) | ORAL | Status: DC | PRN
  Administered 2021-07-29: 650 mg via ORAL
  Filled 2021-07-28: qty 2

## 2021-07-28 MED ORDER — RIVAROXABAN 10 MG PO TABS: 10.0000 mg | ORAL_TABLET | Freq: Every day | ORAL | Status: DC

## 2021-07-28 MED ORDER — RIVAROXABAN 10 MG PO TABS
10.0000 mg | ORAL_TABLET | Freq: Every day | ORAL | Status: DC
  Administered 2021-07-28 – 2021-07-29 (×2): 10 mg via ORAL
  Filled 2021-07-28 (×2): qty 1

## 2021-07-28 MED ORDER — TAMSULOSIN HCL 0.4 MG PO CAPS
0.4000 mg | ORAL_CAPSULE | Freq: Every morning | ORAL | Status: DC
  Administered 2021-07-29 – 2021-08-03 (×3): 0.4 mg via ORAL
  Filled 2021-07-28 (×4): qty 1

## 2021-07-28 MED ORDER — RISPERIDONE 2 MG PO TBDP: 2.0000 mg | ORAL_TABLET | Freq: Every day | ORAL | Status: DC

## 2021-07-28 NOTE — ED Provider Notes (Signed)
[facility] EMERGENCY DEPARTMENT Provider Note   CSN: [id number] Arrival date & time: 07/28/21  1209     History Chief Complaint  Patient presents with   Stroke Symptoms    Marvin Mahoney is a 60 y.o. male.  The history is provided by the patient, a caregiver and medical records. No language interpreter was used.   60 year old male significant history of mental handicap, cerebral palsy, prior stroke with right-sided deficit who lives in a group home brought here with concern of stroke.  History obtained through caregiver who is at bedside.  Per caregiver, patient went to his daycare yesterday and apparently had a fall.  He was able to get up and ambulate afterward however since this morning patient unable to ambulate which concerns them.  Unable to obtain what has happened causing the fall.  Caregiver mentioned that patient got up and walk around fine and last time he was able to walk was last night.  This morning however he report he cannot pick up his right leg.  He does not complain of any neck pain back pain or leg pain.  No knee pain patient denies any significant headache neck pain chest pain trouble breathing abdominal pain back pain or pain to his extremities.  Normally he is able to ambulate without assist.  Caregiver states that this is not his norm.    Past Medical History:  Diagnosis Date   Cerebral palsy ([place])    Hyperprolactinemia ([place])    Hypertension    Mental retardation    Stroke ([place])     There are no problems to display for this patient.   Past Surgical History:  Procedure Laterality Date   NO PAST SURGERIES     unknown       Family History  Family history unknown: Yes    Social History   Tobacco Use   Smoking status: Former   Smokeless tobacco: Never  Scientific laboratory technician Use: Never used  Substance Use Topics   Alcohol use: No   Drug use: Never    Home Medications Prior to Admission medications   Medication Sig Start Date End  Date Taking? Authorizing Provider  PARoxetine (PAXIL) 20 MG tablet Take 60 mg by mouth at bedtime.     [provider]  risperiDONE (RISPERDAL M-TABS) 2 MG disintegrating tablet Take 2 mg by mouth at bedtime.    [provider]    Allergies    Patient has no known allergies.  Review of Systems   Review of Systems  All other systems reviewed and are negative.  Physical Exam Updated Vital Signs BP 129/84 (BP Location: Left Arm)   Pulse (!) 110   Temp 99.6 F (37.6 C)   Resp 18   SpO2 95%   Physical Exam Vitals and nursing note reviewed.  Constitutional:      General: He is not in acute distress.    Appearance: He is well-developed.  HENT:     Head: Normocephalic and atraumatic.  Eyes:     General: Lids are normal.     Conjunctiva/sclera: Conjunctivae normal.     Comments: Right pupil 4 mm nonreactive, left pupil 3 mm and reactive.  Extraocular movement intact.  Neck:     Comments: Neck with full range of motion no midline spine tenderness crepitus or step-off Cardiovascular:     Rate and Rhythm: Normal rate and regular rhythm.     Pulses: Normal pulses.     Heart  sounds: Normal heart sounds.  Pulmonary:     Effort: Pulmonary effort is normal.     Breath sounds: Normal breath sounds.  Abdominal:     Palpations: Abdomen is soft.     Tenderness: There is no abdominal tenderness.  Musculoskeletal:     Cervical back: Normal range of motion and neck supple. No rigidity.  Skin:    Findings: No rash.  Neurological:     Mental Status: He is alert.     GCS: GCS eye subscore is 4. GCS verbal subscore is 5. GCS motor subscore is 6.     Comments: Neurologic exam:  Speech truncated, with simple yes or no response, right pupil 4 mm and nonreactive, left pupil 3 mm and reactive.  Extraocular movements intact  No obvious facial droop Follows commands, right upper extremity in a decorticate position, chronic, unable to lift right lower leg.  Left upper and left  lower extremity with equal strength and movement. Sensation normal to light touch  Gait not tested.  Coordination not tested     ED Results / Procedures / Treatments   Labs (all labs ordered are listed, but only abnormal results are displayed) Labs Reviewed  COMPREHENSIVE METABOLIC PANEL - Abnormal; Notable for the following components:      Result Value   Glucose, Bld 188 (*)    Calcium 8.7 (*)    Total Protein 6.2 (*)    Albumin 3.4 (*)    All other components within normal limits  CBC WITH DIFFERENTIAL/PLATELET - Abnormal; Notable for the following components:   RBC 4.15 (*)    Hemoglobin 12.9 (*)    Neutro Abs 8.8 (*)    Lymphs Abs 0.4 (*)    All other components within normal limits  I-STAT CHEM 8, ED - Abnormal; Notable for the following components:   Glucose, Bld 190 (*)    Calcium, Ion 1.03 (*)    Hemoglobin 12.9 (*)    HCT 38.0 (*)    All other components within normal limits  CBG MONITORING, ED - Abnormal; Notable for the following components:   Glucose-Capillary 171 (*)    All other components within normal limits  SARS CORONAVIRUS 2 (TAT 6-24 HRS)  PROTIME-INR  APTT    EKG None  Radiology CT HEAD WO CONTRAST  Result Date: 07/28/2021 CLINICAL DATA:  Neuro deficit, acute, stroke suspected. Unwitnessed fall now with facial droop, gait abnormality, right-sided weakness. EXAM: CT HEAD WITHOUT CONTRAST TECHNIQUE: Contiguous axial images were obtained from the base of the skull through the vertex without intravenous contrast. COMPARISON:  MRI 06/12/2018 FINDINGS: Brain: Extensive encephalomalacia involving the left cerebral hemisphere in keeping with remote left ACA/MCA territory infarct is again seen. There is marked ex vacuo dilatation of the left lateral ventricle which may communicate with the extra-axial space. Alternatively, this may represent a superimposed large left arachnoid cyst as described on prior MRI examination. Moderate asymmetric parenchymal atrophy  involving the residual left occipital lobe. Evidence of wallerian degeneration involving the left thalamus and left cerebral peduncle. No evidence of acute intracranial hemorrhage or infarct. No abnormal mass effect or midline shift. Borderline ventriculomegaly involving the right lateral ventricle likely reflects the sequela of mild central atrophy and is stable since prior examination. Cerebellum unremarkable. Vascular: No asymmetric hyperdense vasculature at the skull base. Skull: Intact Sinuses/Orbits: The paranasal sinuses are clear. The orbits are unremarkable. Other: The mastoid air cells and middle ear cavities are clear. IMPRESSION: Stable changes of remote left ACA/MCA territory infarct  with extensive left cerebral encephalomalacia which may communicate with the extra-axial space or abut a large left arachnoid cyst. No associated abnormal mass effect or midline shift. No evidence of acute intracranial hemorrhage or infarct. No calvarial fracture. Electronically Signed   By: Fidela Salisbury MD   On: 07/28/2021 13:04   CT Cervical Spine Wo Contrast  Result Date: 07/28/2021 CLINICAL DATA:  Poly trauma. EXAM: CT CERVICAL SPINE WITHOUT CONTRAST TECHNIQUE: Multidetector CT imaging of the cervical spine was performed without intravenous contrast. Multiplanar CT image reconstructions were also generated. COMPARISON:  None. FINDINGS: Alignment: Normal Skull base and vertebrae: No acute fracture. No primary bone lesion or focal pathologic process. Soft tissues and spinal canal: No prevertebral fluid or swelling. No visible canal hematoma. Disc levels: Degenerative cervical spondylosis with mild multilevel disc disease and facet disease. No significant disc protrusions, spinal or foraminal stenosis. Upper chest: The lung apices are grossly clear. Other: No neck mass or hematoma. IMPRESSION: 1. Normal alignment and no acute cervical spine fracture. 2. Degenerative cervical spondylosis with mild multilevel disc  disease and facet disease. Electronically Signed   By: Marijo Sanes M.D.   On: 07/28/2021 15:00    Procedures Procedures   Medications Ordered in ED Medications  sodium chloride flush (NS) 0.9 % injection 3 mL (has no administration in time range)    ED Course  I have reviewed the triage vital signs and the nursing notes.  Pertinent labs & imaging results that were available during my care of the patient were reviewed by me and considered in my medical decision making (see chart for details).    MDM Rules/Calculators/A&P                           BP (!) 143/91   Pulse 90   Temp 99.6 F (37.6 C)   Resp 15   SpO2 98%   Final Clinical Impression(s) / ED Diagnoses Final diagnoses:  Stroke-like symptoms  Right sided weakness    Rx / DC Orders ED Discharge Orders     None      This is a 60 year old gentleman with history of cerebral palsy, prior stroke who is here due to loss of ability to ambulate since this morning.  Last known normal was yesterday however patient did had a reported fall but was able to ambulate afterward without difficulty and did not complain of any significant injury from the fall.  No reproducible pain on exam.  Patient does have right-sided weakness however right lower extremity increased weakness is new.  He is unable to ambulate therefore will need to be admitted for further evaluation including a stroke work-up.  Initial head CT scan without acute intracranial abnormality labs are baseline.  Care discussed with Dr. Tomi Bamberger.   3:34 PM Cervical spine CT unremarkable.  Brain MRI have been ordered.  Appreciate consultation from [facility] hospitalist, Dr. Rogers Blocker, who agrees to admit patient for stroke work-up.   Domenic Moras, PA-C 07/28/21 1540    Dorie Rank, MD 07/28/21 [phone]

## 2021-07-28 NOTE — H&P (Addendum)
Date: 07/28/2021               Patient Name:  Georgios Solla MRN: [id number]  DOB: December 14, 1961 Age / Sex: 60 y.o., male   PCP: Pcp, No         Medical Service: Internal Medicine Teaching Service         Attending Physician: Dr. Evette Doffing    First Contact: Dr. Raymondo Band Pager: [phone]  Second Contact: Dr. Marva Panda Pager: [phone]       After Hours (After 5p/  First Contact Pager: [phone]  weekends / holidays): Second Contact Pager: [phone]   Chief Complaint: new right leg weakness  History of Present Illness: Mr. Cleo Byron is a 60 yo male with a PMHx of HTN, cerebral palsy, prior stroke with right sided deficits (unknown date, >5 years ago), depression, and mental retardation, who presents from his group home for concern of stroke. The history is mostly provided by the patient's caregiver, [name]. [name] notes that Mr. Reo had a fall yesterday while at his daycare facility. Mr. Guadagnoli was able to tell me that he tripped while trying to get up out of his chair. He was able to get up on his own and ambulate afterwards and was fine the rest of the evening. This morning, [name] went to check on Mr. Glaeser and noted that the right side of his face seemed to have a droop and that he was unable to walk anymore because of new right leg weakness. The patient does have right sided weakness at his baseline because of a prior stroke, however, he was able to ambulate on his own prior to this morning. Last known well was yesterday evening, around 8pm. The caregiver also notes that Mr. Due did not eat much of his breakfast this morning, which is unusual for him. He took the patient to an urgent care facility where they encourage the patient to come to the ED for a stroke workup. Patient denies any fevers, chills, headache, dizziness, chest pain, SOB, abd pain, n/v/d, back pain, dysuria, or any other symptoms at this time. He does note that he is now having pain in his right knee.   In the ED, CT head performed and showed no  evidence of acute intracranial hemorrhage or infarct. CT cervical spine with no evidence of acute fractures, but degenerative cervical spondylosis with mild multilevel disc disease and facet disease noted. CXR negative for any acute process.   Meds: confirmed by caregiver Current Meds  Medication Sig   PARoxetine (PAXIL) 20 MG tablet Take 60 mg by mouth at bedtime.    risperiDONE (RISPERDAL) 2 MG tablet Take 2 mg by mouth at bedtime.   tamsulosin (FLOMAX) 0.4 MG CAPS capsule Take 0.4 mg by mouth in the morning.     Allergies: Allergies as of 07/28/2021   (No Known Allergies)   Past Medical History:  Diagnosis Date   Cerebral palsy ([place])    Hyperprolactinemia ([place])    Hypertension    Mental retardation    Stroke [facility]     Family History: no history noted, both of patient's parents are deceased and he has a brother who he has not been in contact with for many years  Social History: former smoker although neither the patient nor the caregiver can elaborate, no alcohol use, no other illicit drug use. Patient resides at [facility] in [place] and has lived there for the past 6 years with his primary caregiver, [name].  Review  of Systems: A complete ROS was negative except as per HPI.   Physical Exam: Blood pressure (!) 143/91, pulse 90, temperature 99.6 F (37.6 C), resp. rate 15, SpO2 98 %. Physical Exam HENT:     Mouth/Throat:     Mouth: Mucous membranes are dry.  Eyes:     Extraocular Movements: Extraocular movements intact.     Comments: Right pupil ~84m, nonreactive. Left pupil 2-374m reactive  Cardiovascular:     Rate and Rhythm: Regular rhythm. Tachycardia present.     Pulses: Normal pulses.     Heart sounds: No murmur heard. Pulmonary:     Effort: No respiratory distress.     Breath sounds: No wheezing, rhonchi or rales.  Abdominal:     General: Bowel sounds are normal.     Palpations: Abdomen is soft.     Tenderness: There is no abdominal tenderness.   Musculoskeletal:        General: Normal range of motion.     Right lower leg: No edema.     Left lower leg: No edema.  Skin:    General: Skin is warm and dry.  Neurological:     Cranial Nerves: Cranial nerve deficit present.     Sensory: No sensory deficit.     Motor: Weakness present.     Comments: CN XI weakness: unable to shrug shoulder on the right Unable to lift right arm (chronic) and now unable to move/lift right leg (acute)      EKG: personally reviewed my interpretation is sinus tachycardia at 113bpm.  CXR: personally reviewed my interpretation is: negative for any acute process  Assessment & Plan by Problem: Active Problems:   * No active hospital problems. *  #Right sided weakness Patient has a history of a prior stroke >6 years ago, with right sided residual deficits. Unknown when this stroke occurred, however, the patient is not on any medical therapy for hypertension, cholesterol, or any blood thinners. Last known well time was yesterday evening around 8pm and as of this morning, the patient is unable to walk due to new right leg weakness and was reported to have a right sided facial droop per his caregiver. CT head showed stable changes of the left ACA/MCA territory infarct and no evidence of acute intracranial hemorrhage/infarct.  - MRI brain pending - CBC, BMP, lipid panel, and A1c pending - PT/OT eval and treat  #Right knee pain Patient had a mechanical fall yesterday afternoon and has had right sided knee pain since then. He was able to get up and walk after the fall. - Right knee xray pending  #Depression Continue home medications, risperidone and paroxetine.    Dispo: Admit patient to Inpatient with expected length of stay greater than 2 midnights.  Caregiver: ChHarrie [phone]: AtDorethea ClanDO 07/28/2021, 3:17 PM  Pager: '@[phone]'$ @ After 5pm on weekdays and 1pm on weekends: On Call pager: [phone]

## 2021-07-28 NOTE — ED Notes (Signed)
Charles, pt caregiver, would like updates when available. Number is in chart.

## 2021-07-28 NOTE — ED Triage Notes (Signed)
Pt from a group home.  Reports fall yesterday.  Woke up with facial droop, difficulty ambulating, and R sided leg weakness this morning.  History of stroke with R sided deficits.

## 2021-07-28 NOTE — ED Notes (Signed)
Pt back from MRI 

## 2021-07-28 NOTE — ED Notes (Signed)
Pt eating dinner at this time

## 2021-07-28 NOTE — ED Notes (Signed)
Patient transported to MRI 

## 2021-07-28 NOTE — ED Notes (Signed)
Tele

## 2021-07-28 NOTE — ED Notes (Signed)
Barnie Alderman, legal guardian, [phone] would like an update when available

## 2021-07-28 NOTE — ED Notes (Signed)
Per Dr. Laural Golden okay to DC [facility] orders and do q4 neuro checks

## 2021-07-29 ENCOUNTER — Observation Stay (HOSPITAL_COMMUNITY): Payer: Medicare Other

## 2021-07-29 DIAGNOSIS — R29810 Facial weakness: Secondary | ICD-10-CM | POA: Diagnosis present

## 2021-07-29 DIAGNOSIS — E440 Moderate protein-calorie malnutrition: Secondary | ICD-10-CM | POA: Diagnosis present

## 2021-07-29 DIAGNOSIS — G809 Cerebral palsy, unspecified: Secondary | ICD-10-CM | POA: Diagnosis present

## 2021-07-29 DIAGNOSIS — M810 Age-related osteoporosis without current pathological fracture: Secondary | ICD-10-CM | POA: Diagnosis present

## 2021-07-29 DIAGNOSIS — E221 Hyperprolactinemia: Secondary | ICD-10-CM | POA: Diagnosis present

## 2021-07-29 DIAGNOSIS — S72001A Fracture of unspecified part of neck of right femur, initial encounter for closed fracture: Secondary | ICD-10-CM | POA: Diagnosis not present

## 2021-07-29 DIAGNOSIS — S72009A Fracture of unspecified part of neck of unspecified femur, initial encounter for closed fracture: Secondary | ICD-10-CM | POA: Diagnosis present

## 2021-07-29 DIAGNOSIS — I10 Essential (primary) hypertension: Secondary | ICD-10-CM | POA: Diagnosis present

## 2021-07-29 DIAGNOSIS — S72141A Displaced intertrochanteric fracture of right femur, initial encounter for closed fracture: Secondary | ICD-10-CM | POA: Diagnosis present

## 2021-07-29 DIAGNOSIS — M25561 Pain in right knee: Secondary | ICD-10-CM | POA: Diagnosis present

## 2021-07-29 DIAGNOSIS — M47812 Spondylosis without myelopathy or radiculopathy, cervical region: Secondary | ICD-10-CM | POA: Diagnosis present

## 2021-07-29 DIAGNOSIS — Z79899 Other long term (current) drug therapy: Secondary | ICD-10-CM | POA: Diagnosis not present

## 2021-07-29 DIAGNOSIS — F79 Unspecified intellectual disabilities: Secondary | ICD-10-CM | POA: Diagnosis present

## 2021-07-29 DIAGNOSIS — Z23 Encounter for immunization: Secondary | ICD-10-CM | POA: Diagnosis not present

## 2021-07-29 DIAGNOSIS — Z87891 Personal history of nicotine dependence: Secondary | ICD-10-CM | POA: Diagnosis not present

## 2021-07-29 DIAGNOSIS — W1830XA Fall on same level, unspecified, initial encounter: Secondary | ICD-10-CM | POA: Diagnosis present

## 2021-07-29 DIAGNOSIS — F32A Depression, unspecified: Secondary | ICD-10-CM | POA: Diagnosis present

## 2021-07-29 DIAGNOSIS — I69351 Hemiplegia and hemiparesis following cerebral infarction affecting right dominant side: Secondary | ICD-10-CM | POA: Diagnosis not present

## 2021-07-29 DIAGNOSIS — W19XXXA Unspecified fall, initial encounter: Secondary | ICD-10-CM | POA: Diagnosis not present

## 2021-07-29 DIAGNOSIS — Z20822 Contact with and (suspected) exposure to covid-19: Secondary | ICD-10-CM | POA: Diagnosis present

## 2021-07-29 DIAGNOSIS — Z6825 Body mass index (BMI) 25.0-25.9, adult: Secondary | ICD-10-CM | POA: Diagnosis not present

## 2021-07-29 DIAGNOSIS — F0390 Unspecified dementia without behavioral disturbance: Secondary | ICD-10-CM | POA: Diagnosis present

## 2021-07-29 LAB — BASIC METABOLIC PANEL
Anion gap: 7 (ref 5–15)
BUN: 11 mg/dL (ref 6–20)
CO2: 26 mmol/L (ref 22–32)
Calcium: 8.4 mg/dL — ABNORMAL LOW (ref 8.9–10.3)
Chloride: 103 mmol/L (ref 98–111)
Creatinine, Ser: 0.77 mg/dL (ref 0.61–1.24)
GFR, Estimated: >60 mL/min (ref >60)
Glucose, Bld: 115 mg/dL — ABNORMAL HIGH (ref 70–99)
Potassium: 3.7 mmol/L (ref 3.5–5.1)
Sodium: 136 mmol/L (ref 135–145)

## 2021-07-29 LAB — CBC
HCT: 37.1 % — ABNORMAL LOW (ref 39.0–52.0)
Hemoglobin: 12.3 g/dL — ABNORMAL LOW (ref 13.0–17.0)
MCH: 31.1 pg (ref 26.0–34.0)
MCHC: 33.2 g/dL (ref 30.0–36.0)
MCV: 93.9 fL (ref 80.0–100.0)
Platelets: 179 10*3/uL (ref 150–400)
RBC: 3.95 MIL/uL — ABNORMAL LOW (ref 4.22–5.81)
RDW: 12.5 % (ref 11.5–15.5)
WBC: 10.1 10*3/uL (ref 4.0–10.5)
nRBC: 0 % (ref 0.0–0.2)

## 2021-07-29 LAB — HEMOGLOBIN A1C
Hgb A1c MFr Bld: 5.1 % (ref 4.8–5.6)
Mean Plasma Glucose: 99.67 mg/dL

## 2021-07-29 LAB — HIV ANTIBODY (ROUTINE TESTING W REFLEX): HIV Screen 4th Generation wRfx: NONREACTIVE

## 2021-07-29 LAB — SARS CORONAVIRUS 2 (TAT 6-24 HRS): SARS Coronavirus 2: NEGATIVE

## 2021-07-29 LAB — LIPID PANEL
Cholesterol: 142 mg/dL (ref 0–200)
HDL: 50 mg/dL (ref >40)
LDL Cholesterol: 81 mg/dL (ref 0–99)
Total CHOL/HDL Ratio: 2.8 RATIO
Triglycerides: 55 mg/dL (ref <150)
VLDL: 11 mg/dL (ref 0–40)

## 2021-07-29 MED ORDER — HYDROMORPHONE HCL 1 MG/ML IJ SOLN
0.5000 mg | INTRAMUSCULAR | Status: DC | PRN
  Administered 2021-07-29 – 2021-07-31 (×5): 0.5 mg via INTRAVENOUS
  Filled 2021-07-29 (×5): qty 1

## 2021-07-29 NOTE — Progress Notes (Addendum)
HD#1 SUBJECTIVE:  Patient Summary: Marvin Mahoney is a 60 y.o. with a pertinent PMH of HTN, cerebral palsy, prior stroke with residual right sided deficits, depression, and cognitive impairment who presented with new onset right sided weakness and admitted for weakness.   Overnight Events: No acute events overnight.  Interim History: This is hospital day 1 for Marvin Mahoney who was seen and evaluated at the bedside this morning. He continues to endorse right leg pain, most notably in his knee and thigh region. He denies pain anywhere else. R hip xray ordered.  OBJECTIVE:  Vital Signs: Vitals:   07/29/21 1000 07/29/21 1015 07/29/21 1030 07/29/21 1123  BP: (!) 141/95 (!) 139/98 (!) 160/107 (!) 148/87  Pulse: 63 63 81 78  Resp: (!) 22 (!) '21 16 20  '$ Temp:    98.9 F (37.2 C)  TempSrc:    Oral  SpO2: 98% 99% 97% 99%  Weight:      Height:       Supplemental O2: Nasal Cannula SpO2: 99 % O2 Flow Rate (L/min): 2 L/min FiO2 (%): 100 %  Filed Weights   07/28/21 1603  Weight: 78.5 kg    No intake or output data in the 24 hours ending 07/29/21 1210 Net IO Since Admission: No IO data has been entered for this period [07/29/21 1210]  Physical Exam: General: Chronically ill-appearing male laying in bed. No acute distress but appears to be in pain. CV: RRR. No murmurs, rubs, or gallops. No LE edema Pulmonary: Lungs CTAB. Normal effort. No wheezing or rales. Abdominal: Soft, nontender, nondistended. Normal bowel sounds. Extremities: Palpable radial and DP pulses. Limited internal rotation of R lower extremity. R lower extremity is shortened and also atrophied compared to left. R arm contracture noted.  Skin: Warm and dry. No obvious rash or lesions. Neuro: A&O to self and place. Normal sensation. Limited shoulder shrug on R, unable to lift/move right upper extremity (which is chronic) and R lower extremity (new).  Psych: Appropriate mood and affect   Patient Lines/Drains/Airways Status      Active Line/Drains/Airways     Name Placement date Placement time Site Days   Peripheral IV 06/12/18 Left;Posterior Hand 06/12/18  1728  Hand  1143   External Urinary Catheter 06/12/18  1715  --  1143            Pertinent Labs: CBC Latest Ref Rng & Units 07/29/2021 07/28/2021 07/28/2021  WBC 4.0 - 10.5 K/uL 10.1 10.1 -  Hemoglobin 13.0 - 17.0 g/dL 12.3(L) 12.9(L) 12.9(L)  Hematocrit 39.0 - 52.0 % 37.1(L) 39.2 38.0(L)  Platelets 150 - 400 K/uL 179 187 -    CMP Latest Ref Rng & Units 07/29/2021 07/28/2021 07/28/2021  Glucose 70 - 99 mg/dL 115(H) 190(H) 188(H)  BUN 6 - 20 mg/dL '11 13 13  '$ Creatinine 0.61 - 1.24 mg/dL 0.77 0.80 0.92  Sodium 135 - 145 mmol/L 136 141 140  Potassium 3.5 - 5.1 mmol/L 3.7 3.5 3.5  Chloride 98 - 111 mmol/L 103 108 109  CO2 22 - 32 mmol/L 26 - 23  Calcium 8.9 - 10.3 mg/dL 8.4(L) - 8.7(L)  Total Protein 6.5 - 8.1 g/dL - - 6.2(L)  Total Bilirubin 0.3 - 1.2 mg/dL - - 1.0  Alkaline Phos 38 - 126 U/L - - 64  AST 15 - 41 U/L - - 23  ALT 0 - 44 U/L - - 14    Recent Labs    07/28/21 1224  GLUCAP 171*  ASSESSMENT/PLAN:  Assessment: Principal Problem:   Fracture of femoral neck, right (HCC) Active Problems:   CP (cerebral palsy) (HCC)   Dementia (HCC)   Moderate protein-calorie malnutrition (HCC)   Intertrochanteric fracture of right hip (HCC)   Plan: #Right femoral neck fracture Patient has a history of a prior stroke >6 years ago, with right sided residual deficits. The patient sustained a fall on 8/5 and as of the morning of 8/6, the patient is unable to walk due to new right leg weakness. CT head showed stable changes of the left ACA/MCA territory infarct and no evidence of acute intracranial hemorrhage/infarct. MRI brain negative for new infarct as well. R knee xray performed with no acute osseous abnormality noted, however, R hip xray performed and showed a displaced and slightly impacted basicervical fracture of the right femoral neck that is  subacute to chronic appearing.  - Orthopedics consulted, appreciate recommendations - Pain management with dilaudid 0.'5mg'$  q4 hours PRN  - PT/OT eval and treat  #Depression Continue home medications, risperidone and paroxetine.   Best Practice: Diet: NPO pending ortho recs IVF: Fluids: none VTE: rivaroxaban (XARELTO) tablet 10 mg Start: 07/28/21 2115 (received today, discontinued until ortho recs) Code: Full Therapy Recs: SNF Contact: Legal Ronda: [phone] or [name] (caregiver) [phone] DISPO: Anticipated discharge to [facility] facility pending clinical improvement and ortho recs  Signature: Brecklyn Galvis, D.O.  Internal Medicine Resident, PGY-1 [facility]  Pager: [phone] 12:10 PM, 07/29/2021   Please contact the on call pager after 5 pm and on weekends at [phone].

## 2021-07-29 NOTE — ED Notes (Signed)
Pt alert, NAD, calm, interactive, oriented to person and place. Does not know season, month, age, year, city or state. Denies sx or complaints, but admits to some nausea and some sob. Denies pain.

## 2021-07-29 NOTE — ED Notes (Signed)
Dr. Laural Golden notified of pt BP

## 2021-07-29 NOTE — ED Notes (Signed)
Pt brief wet. Pt changed into new brief and new gown, given blanket. Pt endorses pain in right leg

## 2021-07-29 NOTE — ED Notes (Signed)
PT/OT at [facility]

## 2021-07-29 NOTE — Evaluation (Signed)
Occupational Therapy Evaluation Patient Details Name: Marvin Mahoney MRN: [id number] DOB: May 19, 1961 Today's Date: 07/29/2021    History of Present Illness Pt is a 60 y.o. male who presented 07/28/21 s/p fall at his daycare facility and noted increased R-sided weakness. CT and MRI negative for acute intracranial processes. Imaging of R knee negative for acute osseous abnormalities. Chronic appearing RIGHT femoral neck fracture with superolateral displacement of at least 1.5 cm. PMHx of HTN, cerebral palsy, prior stroke with right sided deficits (unknown date, >5 years ago), HTN, depression, and mental retardation   Clinical Impression   This 60 yo male admitted with above presents to acute OT with PLOF per Marvin Mahoney (his caregiver) as being able to toilet himself, walk unassisted and without AD, bath and dress himself with setup/intermittent S, ambulate up and down steps with use of one hand on hand rail (18 steps),and goes to day program during the weak. Currently pt is complaining of pain in right knee with any movement, not wanting to put any weight on it when attempting to stand, needing +2 for all mobility, and reporting he needed more A home pta than he actually did. He will continue to benefit from acute OT with follow up at SNF currently recommended. We will continue to follow.    Follow Up Recommendations  SNF;Supervision/Assistance - 24 hour    Equipment Recommendations  Other (comment) (TBD next venue)           Precautions / Restrictions Precautions Precautions: Fall Restrictions Weight Bearing Restrictions: No      Mobility Bed Mobility Overal bed mobility: Needs Assistance Bed Mobility: Supine to Sit;Sit to Supine     Supine to sit: Max assist;+2 for physical assistance;HOB elevated Sit to supine: Mod assist;+2 for physical assistance   General bed mobility comments: Pt unable to attempt roll to side, thus cued pt to manage legs off R EOB. MaxAx2 to manage legs off bed and  elevate trunk to sit up and square hips with EOB. ModAx2 to manage legs back onto bed due to pain and to control trunk descent.    Transfers Overall transfer level: Needs assistance Equipment used: 2 person hand held assist Transfers: Sit to/from Stand Sit to Stand: Mod assist;+2 physical assistance         General transfer comment: ModAx2 to power up to stand with UE support on therapists, x2 reps from EOB. Pt tends to bring his L foot posteriorly, placing weight through his forefoot while bringing his R foot anterior and slightly hovering it off the ground, likely due to pain.    Balance Overall balance assessment: Needs assistance Sitting-balance support: Single extremity supported;No upper extremity supported;Feet supported Sitting balance-Leahy Scale: Poor Sitting balance - Comments: Pt with posterior and R lateral lean, needing min-modA majority of time to maintain balance, intermittently briefly being able to hold his balance with min guard assist. Postural control: Posterior lean;Right lateral lean Standing balance support: Bilateral upper extremity supported Standing balance-Leahy Scale: Poor Standing balance comment: ModAx2 and UE support to stand statically x2 bouts of ~30 sec each, with pt lifting his R foot off the ground likely due to pain.                           ADL either performed or assessed with clinical judgement   ADL Overall ADL's : Needs assistance/impaired Eating/Feeding: Set up;Bed level   Grooming: Set up;Bed level   Upper Body Bathing: Minimal assistance;Sitting  Lower Body Bathing: Total assistance Lower Body Bathing Details (indicate cue type and reason): Max A +2 sit<>stand Upper Body Dressing : Minimal assistance;Sitting   Lower Body Dressing: Total assistance Lower Body Dressing Details (indicate cue type and reason): Max A +2 sit<>stand   Toilet Transfer Details (indicate cue type and reason): unable at this time due to pain in  right knee (and pt would not WB through right knee) Toileting- Clothing Manipulation and Hygiene: Total assistance Toileting - Clothing Manipulation Details (indicate cue type and reason): Max A +2 sit<>stand             Vision Patient Visual Report: No change from baseline              Pertinent Vitals/Pain Pain Assessment: Faces Faces Pain Scale: Hurts whole lot Pain Location: R knee Pain Descriptors / Indicators: Discomfort;Grimacing;Guarding;Moaning Pain Intervention(s): Limited activity within patient's tolerance;Monitored during session;Repositioned     Hand Dominance Left   Extremity/Trunk Assessment Upper Extremity Assessment Upper Extremity Assessment: RUE deficits/detail RUE Deficits / Details: CP--non functional UE with flexion tone   Lower Extremity Assessment Lower Extremity Assessment: RLE deficits/detail;LLE deficits/detail;Generalized weakness RLE Deficits / Details: Actively flexes knee sitting EOB and extends knee to push leg anteriorly and hover foot with standing, unable to follow cues for formal MMT and due to pain LLE Deficits / Details: Unable to follow cues for formal MMT but displays WFL AROM with functional mobility   Cervical / Trunk Assessment Cervical / Trunk Assessment: Kyphotic   Communication Communication Communication: No difficulties   Cognition Arousal/Alertness: Awake/alert Behavior During Therapy: Flat affect Overall Cognitive Status: History of cognitive impairments - at baseline                                 General Comments: Pt with diagnosis of cerebral palsy and mental retardation at baseline. Pt with difficulty identifying L from R, needing cues to point out where the pain is. Difficulty following commands to perform MMT. Did not give [place] correct information as to PLOF (this was noted after talking to Marvin Mahoney his caregiver).   General Comments  VSS on RA but pt reporting dizziness sitting EOB             Home Living Family/patient expects to be discharged to:: Skilled nursing facility                                 Additional Comments: [place] with 18-20 stairs with bil rails to get to his bedroom upstairs. Tub/shower combo with seat available. Goes to daycare during day.      Prior Functioning/Environment Level of Independence: Needs assistance  Gait / Transfers Assistance Needed: Caregiver at group home reports pt is capable of ambulating and negotiating stairs using the rail independently without AD/AE. ADL's / Homemaking Assistance Needed: Has tub/shower combo and has a seat, but does not use seat. Needs assistance with set-up for bathing and dressing only. Capable of toileting himself. Chuck manages pt's meds and meals. Communication / Swallowing Assistance Needed: Reports he feeds himself. Comments: Does not have w/c.        OT Problem List: Decreased strength;Decreased range of motion;Decreased activity tolerance;Impaired balance (sitting and/or standing);Decreased coordination;Decreased cognition;Decreased safety awareness;Impaired UE functional use;Impaired tone;Pain      OT Treatment/Interventions: Self-care/ADL training;DME and/or AE instruction;Patient/family education;Balance training;Therapeutic activities    OT  Goals(Current goals can be found in the care plan section) Acute Rehab OT Goals Patient Stated Goal: to eat OT Goal Formulation: With patient Time For Goal Achievement: 08/12/21 Potential to Achieve Goals: Good  OT Frequency: Min 2X/week       Co-evaluation PT/OT/SLP Co-Evaluation/Treatment: Yes Reason for Co-Treatment: Complexity of the patient's impairments (multi-system involvement);Necessary to address cognition/behavior during functional activity;For patient/therapist safety;To address functional/ADL transfers PT goals addressed during session: Mobility/safety with mobility;Balance OT goals addressed during session: ADL's and  self-care;Strengthening/ROM      AM-PAC OT "6 Clicks" Daily Activity     Outcome Measure Help from another person eating meals?: A Little Help from another person taking care of personal grooming?: A Little Help from another person toileting, which includes using toliet, bedpan, or urinal?: A Lot Help from another person bathing (including washing, rinsing, drying)?: A Lot Help from another person to put on and taking off regular upper body clothing?: A Little Help from another person to put on and taking off regular lower body clothing?: Total 6 Click Score: 14   End of Session Equipment Utilized During Treatment: Gait belt Nurse Communication: Mobility status  Activity Tolerance: Patient limited by pain Patient left: in bed;with call bell/phone within reach  OT Visit Diagnosis: Unsteadiness on feet (R26.81);Other abnormalities of gait and mobility (R26.89);Muscle weakness (generalized) (M62.81);Pain Pain - Right/Left: Right Pain - part of body: Knee                Time: [id number] OT Time Calculation (min): 19 min Charges:  OT General Charges $OT Visit: 1 Visit OT Evaluation $OT Eval Moderate Complexity: [place], OTR/L Acute [employer] Pager [phone] Office [phone]    Almon Register 07/29/2021, 12:50 PM

## 2021-07-29 NOTE — Progress Notes (Signed)
Physical Therapy Evaluation Patient Details Name: Marvin Mahoney MRN: [id number] DOB: [birth year] Today's Date: 07/29/2021   History of Present Illness  Pt is a 60 y.o. male who presented 07/28/21 s/p fall at his daycare facility and noted increased R-sided weakness. CT and MRI negative for acute intracranial processes. Imaging of R knee negative for acute osseous abnormalities. Chronic appearing RIGHT femoral neck fracture with superolateral displacement of at least 1.5 cm. PMHx of HTN, cerebral palsy, prior stroke with right sided deficits (unknown date, >5 years ago), HTN, depression, and mental retardation   Clinical Impression  Pt presents with condition above and deficits mentioned below, see PT Problem List. PTA, he was living at a group home with a level entry but there were 18-20 stairs with rails to get to his bedroom upstairs. PTA, he was walking without an AD and navigating stairs using the rails without assistance. He needs assistance to set-up for bathing and dressing. Currently, pt is exhibiting severe R knee pain limiting his ability to bear weight on it, needing modAx2 to transfer to stand with UE support. He was unable to tolerate taking steps today. He displays generalized weakness, balance deficits, and decreased activity tolerance. Pt is at high risk for falls. Recommending pt follow-up with skilled PT for short-term rehab at a SNF to maximize his safety and independence with all functional mobility prior to return to his group home. Will continue to follow acutely.    Follow Up Recommendations SNF;Supervision/Assistance - 24 hour    Equipment Recommendations  Rolling walker with 5" wheels;3in1 (PT)    Recommendations for Other Services       Precautions / Restrictions Precautions Precautions: Fall Restrictions Weight Bearing Restrictions: No      Mobility  Bed Mobility Overal bed mobility: Needs Assistance Bed Mobility: Supine to Sit;Sit to Supine     Supine to sit: Max  assist;+2 for physical assistance;HOB elevated Sit to supine: Mod assist;+2 for physical assistance   General bed mobility comments: Pt unable to attempt roll to side, thus cued pt to manage legs off R EOB. MaxAx2 to manage legs off bed and elevate trunk to sit up and square hips with EOB. ModAx2 to manage legs back onto bed due to pain and to control trunk descent.    Transfers Overall transfer level: Needs assistance Equipment used: 2 person hand held assist Transfers: Sit to/from Stand Sit to Stand: Mod assist;+2 physical assistance         General transfer comment: ModAx2 to power up to stand with UE support on therapists, x2 reps from EOB. Pt tends to bring his L foot posteriorly, placing weight through his forefoot while bringing his R foot anterior and slightly hovering it off the ground, likely due to pain.  Ambulation/Gait             General Gait Details: Unable.  Stairs            Wheelchair Mobility    Modified Rankin (Stroke Patients Only) Modified Rankin (Stroke Patients Only) Pre-Morbid Rankin Score: Severe disability Modified Rankin: Severe disability     Balance Overall balance assessment: Needs assistance Sitting-balance support: Single extremity supported;No upper extremity supported;Feet supported Sitting balance-Leahy Scale: Poor Sitting balance - Comments: Pt with posterior and R lateral lean, needing min-modA majority of time to maintain balance, intermittently briefly being able to hold his balance with min guard assist. Postural control: Posterior lean;Right lateral lean Standing balance support: Bilateral upper extremity supported Standing balance-Leahy Scale: Poor Standing balance comment:  ModAx2 and UE support to stand statically x2 bouts of ~30 sec each, with pt lifting his R foot off the ground likely due to pain.                             Pertinent Vitals/Pain Pain Assessment: Faces Faces Pain Scale: Hurts whole  lot Pain Location: R knee Pain Descriptors / Indicators: Discomfort;Grimacing;Guarding;Moaning Pain Intervention(s): Limited activity within patient's tolerance;Monitored during session;Repositioned    Home Living Family/patient expects to be discharged to:: Group home                 Additional Comments: [place] with 18-20 stairs with bil rails to get to his bedroom upstairs. Tub/shower combo with seat available. Goes to daycare during day.    Prior Function Level of Independence: Needs assistance   Gait / Transfers Assistance Needed: Caregiver at group home reports pt is capable of ambulating and negotiating stairs using the rail independently without AD/AE.  ADL's / Homemaking Assistance Needed: Has tub/shower combo and has a seat, but does not use seat. Needs assistance with set-up for bathing and dressing only. Capable of toileting himself. Chuck manages pt's meds and meals.  Comments: Does not have w/c.     Hand Dominance   Dominant Hand: Left    Extremity/Trunk Assessment   Upper Extremity Assessment Upper Extremity Assessment: Defer to OT evaluation    Lower Extremity Assessment Lower Extremity Assessment: RLE deficits/detail;LLE deficits/detail;Generalized weakness RLE Deficits / Details: Actively flexes knee sitting EOB and extends knee to push leg anteriorly and hover foot with standing, unable to follow cues for formal MMT and due to pain LLE Deficits / Details: Unable to follow cues for formal MMT but displays WFL AROM with functional mobility    Cervical / Trunk Assessment Cervical / Trunk Assessment: Kyphotic  Communication   Communication: No difficulties  Cognition Arousal/Alertness: Awake/alert Behavior During Therapy: Flat affect Overall Cognitive Status: History of cognitive impairments - at baseline                                 General Comments: Pt with diagnosis of cerebral palsy and mental retardation at baseline. Pt  with difficulty identifying L from R, needing cues to point out where the pain is. Difficulty following commands to perform MMT.      General Comments General comments (skin integrity, edema, etc.): VSS on RA but pt reporting dizziness sitting EOB    Exercises     Assessment/Plan    PT Assessment Patient needs continued PT services  PT Problem List Decreased strength;Decreased activity tolerance;Decreased range of motion;Decreased balance;Decreased mobility;Decreased coordination;Decreased cognition;Decreased knowledge of use of DME;Decreased safety awareness;Pain       PT Treatment Interventions Gait training;DME instruction;Stair training;Functional mobility training;Therapeutic exercise;Therapeutic activities;Balance training;Neuromuscular re-education;Cognitive remediation;Patient/family education    PT Goals (Current goals can be found in the Care Plan section)  Acute Rehab PT Goals Patient Stated Goal: to eat PT Goal Formulation: With patient/family Time For Goal Achievement: 08/12/21 Potential to Achieve Goals: Good    Frequency Min 2X/week   Barriers to discharge        Co-evaluation PT/OT/SLP Co-Evaluation/Treatment: Yes Reason for Co-Treatment: Complexity of the patient's impairments (multi-system involvement);Necessary to address cognition/behavior during functional activity;For patient/therapist safety;To address functional/ADL transfers PT goals addressed during session: Mobility/safety with mobility;Balance         AM-PAC PT "  6 Clicks" Mobility  Outcome Measure Help needed turning from your back to your side while in a flat bed without using bedrails?: Total Help needed moving from lying on your back to sitting on the side of a flat bed without using bedrails?: Total Help needed moving to and from a bed to a chair (including a wheelchair)?: Total Help needed standing up from a chair using your arms (e.g., wheelchair or bedside chair)?: Total Help needed to  walk in hospital room?: Total Help needed climbing 3-5 steps with a railing? : Total 6 Click Score: 6    End of Session Equipment Utilized During Treatment: Gait belt Activity Tolerance: Patient limited by pain Patient left: in bed;with call bell/phone within reach Nurse Communication: Mobility status;Need for lift equipment PT Visit Diagnosis: Unsteadiness on feet (R26.81);Muscle weakness (generalized) (M62.81);History of falling (Z91.81);Difficulty in walking, not elsewhere classified (R26.2);Other symptoms and signs involving the nervous system (R29.898);Pain Pain - Right/Left: Right Pain - part of body: Knee    Time: [id number] PT Time Calculation (min) (ACUTE ONLY): 18 min   Charges:   PT Evaluation $PT Eval Moderate Complexity: 1 Mod          Moishe Spice, PT, DPT Acute Rehabilitation Services  Pager: [phone] Office: [phone]   Orvan Falconer 07/29/2021, 10:59 AM

## 2021-07-29 NOTE — Hospital Course (Addendum)
Marvin Mahoney is a 60 y.o. with a pertinent PMH of HTN, cerebral palsy, prior stroke with residual right sided deficits, depression, and cognitive impairment who presented with new onset right sided weakness and admitted for weakness and right femoral neck fracture.  #Right femoral neck fracture #Osteoporosis  Patient has a history of a prior stroke >6 years ago, with right sided residual deficits. The patient sustained a fall on 8/5 and as of the morning of 8/6, the patient is unable to walk due to new right leg weakness and possibly had a facial droop noted by caregiver. CT head showed stable changes of the left ACA/MCA territory infarct and no evidence of acute intracranial hemorrhage/infarct. MRI brain negative for new infarct as well. No facial droop noted on exam, however, patient has very limited range of motion of right lower extremity. Patient has a lot of pain upon internal rotation of the right hip and the right leg is also noted to be shorter and more atrophied when compared to the left. R knee xray performed with no acute osseous abnormality noted, however, R hip xray performed and showed a displaced and slightly impacted basicervical fracture of the right femoral neck that is subacute to chronic appearing. Orthopedics consulted on 8/7 and advised that the patient have a hemiarthroplasty. Pain management dilaudid 0.'5mg'$  q4 hours PRN, although not requiring frequent doses. Patient was complaining of more pain on 8/9, specifically in his right knee. MRI of the knee was unable to be performed due to the amount of pain that the patient was in. Dilaudid increased to '1mg'$  q4 hours PRN. Patient went to the OR with ortho for right hip hemiarthropalsty on 8/10. Also had wound vac placed and is advised to have portable wound vac on discharge. On post op day 1, the patient remained stable and his pain was significantly improved. Remained pain free on post op day 2 and was medically stable for discharge. Will be  discharged with alendronate as he sustained a fragility fracture, which is diagnostic of osteoporosis.

## 2021-07-29 NOTE — ED Notes (Addendum)
Pt started on 2L [place] to keep O2 sats above 92%

## 2021-07-30 DIAGNOSIS — S72001A Fracture of unspecified part of neck of right femur, initial encounter for closed fracture: Secondary | ICD-10-CM | POA: Diagnosis not present

## 2021-07-30 NOTE — Social Work (Signed)
Please be advised that the above-named patient will require a short-term nursing home stay-anticipated 30 days or less for rehabilitation and strengthening. The plan is for return home.  

## 2021-07-30 NOTE — Consult Note (Addendum)
Reason for Consult:Right hip fx Referring Physician: Lalla Brothers Time called: [id number] Time at bedside: 1104   Marvin Mahoney is an 60 y.o. male.  HPI: Marvin Mahoney fell at the group home where he resides on 8/5. He was able to get up and walk unassisted afterwards.The next morning he was found to have some facial drooping and a new c/o RLE pain. He was brought to the ED for stroke workup. X-rays showed a right hip fx. The patient denies hip and pain and c/o persistently about knee pain on that side. He was ambulating without assistive devices prior to this admission.  Past Medical History:  Diagnosis Date   Cerebral palsy ([place])    Hyperprolactinemia ([place])    Hypertension    Mental retardation    Stroke [facility])     Past Surgical History:  Procedure Laterality Date   NO PAST SURGERIES     unknown    Family History  Family history unknown: Yes    Social History:  reports that he has quit smoking. He has never used smokeless tobacco. He reports that he does not drink alcohol and does not use drugs.  Allergies: No Known Allergies  Medications: I have reviewed the patient's current medications.  Results for orders placed or performed during the hospital encounter of 07/28/21 (from the past 48 hour(s))  Protime-INR     Status: None   Collection Time: 07/28/21 12:21 PM  Result Value Ref Range   Prothrombin Time 14.8 11.4 - 15.2 seconds   INR 1.2 0.8 - 1.2    Comment: (NOTE) INR goal varies based on device and disease states. Performed at [facility], [place] [address] [place], [place] [address]   APTT     Status: None   Collection Time: 07/28/21 12:21 PM  Result Value Ref Range   aPTT 26 24 - 36 seconds    Comment: Performed at [facility] [address], [place], [place] [address]  Comprehensive metabolic panel     Status: Abnormal   Collection Time: 07/28/21 12:21 PM  Result Value Ref Range   Sodium 140 135 - 145 mmol/L   Potassium 3.5 3.5 - 5.1 mmol/L   Chloride 109  98 - 111 mmol/L   CO2 23 22 - 32 mmol/L   Glucose, Bld 188 (H) 70 - 99 mg/dL    Comment: Glucose reference range applies only to samples taken after fasting for at least 8 hours.   BUN 13 6 - 20 mg/dL   Creatinine, Ser 0.92 0.61 - 1.24 mg/dL   Calcium 8.7 (L) 8.9 - 10.3 mg/dL   Total Protein 6.2 (L) 6.5 - 8.1 g/dL   Albumin 3.4 (L) 3.5 - 5.0 g/dL   AST 23 15 - 41 U/L   ALT 14 0 - 44 U/L   Alkaline Phosphatase 64 38 - 126 U/L   Total Bilirubin 1.0 0.3 - 1.2 mg/dL   GFR, Estimated >60 >60 mL/min    Comment: (NOTE) Calculated using the CKD-EPI Creatinine Equation (2021)    Anion gap 8 5 - 15    Comment: Performed at [facility], [place] [address] [place],  [address]  CBG monitoring, ED     Status: Abnormal   Collection Time: 07/28/21 12:24 PM  Result Value Ref Range   Glucose-Capillary 171 (H) 70 - 99 mg/dL    Comment: Glucose reference range applies only to samples taken after fasting for at least 8 hours.   Comment 1  Notify RN   I-stat chem 8, ED     Status: Abnormal   Collection Time: 07/28/21 12:31 PM  Result Value Ref Range   Sodium 141 135 - 145 mmol/L   Potassium 3.5 3.5 - 5.1 mmol/L   Chloride 108 98 - 111 mmol/L   BUN 13 6 - 20 mg/dL   Creatinine, Ser 0.80 0.61 - 1.24 mg/dL   Glucose, Bld 190 (H) 70 - 99 mg/dL    Comment: Glucose reference range applies only to samples taken after fasting for at least 8 hours.   Calcium, Ion 1.03 (L) 1.15 - 1.40 mmol/L   TCO2 23 22 - 32 mmol/L   Hemoglobin 12.9 (L) 13.0 - 17.0 g/dL   HCT 38.0 (L) 39.0 - 52.0 %  CBC with Differential/Platelet     Status: Abnormal   Collection Time: 07/28/21 12:40 PM  Result Value Ref Range   WBC 10.1 4.0 - 10.5 K/uL   RBC 4.15 (L) 4.22 - 5.81 MIL/uL   Hemoglobin 12.9 (L) 13.0 - 17.0 g/dL   HCT 39.2 39.0 - 52.0 %   MCV 94.5 80.0 - 100.0 fL   MCH 31.1 26.0 - 34.0 pg   MCHC 32.9 30.0 - 36.0 g/dL   RDW 12.8 11.5 - 15.5 %   Platelets 187 150 - 400 K/uL   nRBC 0.0 0.0 - 0.2 %    Neutrophils Relative % 87 %   Neutro Abs 8.8 (H) 1.7 - 7.7 K/uL   Lymphocytes Relative 4 %   Lymphs Abs 0.4 (L) 0.7 - 4.0 K/uL   Monocytes Relative 9 %   Monocytes Absolute 0.9 0.1 - 1.0 K/uL   Eosinophils Relative 0 %   Eosinophils Absolute 0.0 0.0 - 0.5 K/uL   Basophils Relative 0 %   Basophils Absolute 0.0 0.0 - 0.1 K/uL   Immature Granulocytes 0 %   Abs Immature Granulocytes 0.04 0.00 - 0.07 K/uL    Comment: Performed at [facility], [address], [place], [place] [address]  SARS CORONAVIRUS 2 (TAT 6-24 HRS) Nasopharyngeal Nasopharyngeal Swab     Status: None   Collection Time: 07/28/21  2:24 PM   Specimen: Nasopharyngeal Swab  Result Value Ref Range   SARS Coronavirus 2 NEGATIVE NEGATIVE    Comment: (NOTE) SARS-CoV-2 target nucleic acids are NOT DETECTED.  The SARS-CoV-2 RNA is generally detectable in upper and lower respiratory specimens during the acute phase of infection. Negative results do not preclude SARS-CoV-2 infection, do not rule out co-infections with other pathogens, and should not be used as the sole basis for treatment or other patient management decisions. Negative results must be combined with clinical observations, patient history, and epidemiological information. The expected result is Negative.  Fact Sheet for Patients: SugarRoll.be  Fact Sheet for Healthcare Providers: https://www.woods-mathews.com/  This test is not yet approved or cleared by the [place] FDA and  has been authorized for detection and/or diagnosis of SARS-CoV-2 by FDA under an Emergency Use Authorization (EUA). This EUA will remain  in effect (meaning this test can be used) for the duration of the COVID-19 declaration under Se ction 564(b)(1) of the Act, 21 U.S.C. section 360bbb-3(b)(1), unless the authorization is terminated or revoked sooner.  Performed at [facility], [place] [address] [place], [place] [address]    HIV Antibody (routine testing w rflx)     Status: None   Collection Time: 07/29/21  3:50 AM  Result Value Ref Range   HIV Screen  4th Generation wRfx Non Reactive Non Reactive    Comment: Performed at [facility], [place] [address], [place], [place] [address]  Hemoglobin A1c     Status: None   Collection Time: 07/29/21  3:50 AM  Result Value Ref Range   Hgb A1c MFr Bld 5.1 4.8 - 5.6 %    Comment: (NOTE) Pre diabetes:          5.7%-6.4%  Diabetes:              >6.4%  Glycemic control for   <7.0% adults with diabetes    Mean Plasma Glucose 99.67 mg/dL    Comment: Performed at [facility] [address], [place], [place] [id number]  Basic metabolic panel     Status: Abnormal   Collection Time: 07/29/21  3:50 AM  Result Value Ref Range   Sodium 136 135 - 145 mmol/L   Potassium 3.7 3.5 - 5.1 mmol/L   Chloride 103 98 - 111 mmol/L   CO2 26 22 - 32 mmol/L   Glucose, Bld 115 (H) 70 - 99 mg/dL    Comment: Glucose reference range applies only to samples taken after fasting for at least 8 hours.   BUN 11 6 - 20 mg/dL   Creatinine, Ser 0.77 0.61 - 1.24 mg/dL   Calcium 8.4 (L) 8.9 - 10.3 mg/dL   GFR, Estimated >60 >60 mL/min    Comment: (NOTE) Calculated using the CKD-EPI Creatinine Equation (2021)    Anion gap 7 5 - 15    Comment: Performed at [facility] [address], [place], [place] [address]  CBC     Status: Abnormal   Collection Time: 07/29/21  3:50 AM  Result Value Ref Range   WBC 10.1 4.0 - 10.5 K/uL   RBC 3.95 (L) 4.22 - 5.81 MIL/uL   Hemoglobin 12.3 (L) 13.0 - 17.0 g/dL   HCT 37.1 (L) 39.0 - 52.0 %   MCV 93.9 80.0 - 100.0 fL   MCH 31.1 26.0 - 34.0 pg   MCHC 33.2 30.0 - 36.0 g/dL   RDW 12.5 11.5 - 15.5 %   Platelets 179 150 - 400 K/uL   nRBC 0.0 0.0 - 0.2 %    Comment: Performed at [facility], [place] [address], [place], [place] [address]  Lipid panel     Status: None   Collection Time: 07/29/21  3:50 AM  Result Value Ref Range   Cholesterol  142 0 - 200 mg/dL   Triglycerides 55 <150 mg/dL   HDL 50 >40 mg/dL   Total CHOL/HDL Ratio 2.8 RATIO   VLDL 11 0 - 40 mg/dL   LDL Cholesterol 81 0 - 99 mg/dL    Comment:        Total Cholesterol/HDL:CHD Risk Coronary Heart Disease Risk Table                     Men   Women  1/2 Average Risk   3.4   3.3  Average Risk       5.0   4.4  2 X Average Risk   9.6   7.1  3 X Average Risk  23.4   11.0        Use the calculated Patient Ratio above and the CHD Risk Table to determine the patient's CHD Risk.        ATP III CLASSIFICATION (LDL):  <100     mg/dL   Optimal  100-129  mg/dL   Near or Above                    Optimal  130-159  mg/dL   Borderline  160-189  mg/dL   High  >190     mg/dL   Very High Performed at [facility] [address] [place], [place] [address]     DG Chest 1 View  Result Date: 07/29/2021 CLINICAL DATA:  60 year old male with history of right-sided hip pain after a fall yesterday. EXAM: CHEST  1 VIEW COMPARISON:  Chest x-ray 07/28/2021. FINDINGS: Elevation of the right hemidiaphragm, similar to the prior study. Ill-defined opacities and areas of interstitial prominence in the medial aspect of the right upper lobe. Left lung is clear. No pleural effusions. No pneumothorax. No evidence of pulmonary edema. Heart size is normal. The patient is rotated to the right on today's exam, resulting in distortion of the mediastinal contours and reduced diagnostic sensitivity and specificity for mediastinal pathology. IMPRESSION: 1. Ill-defined opacity and interstitial prominence in the medial aspect of the right upper lobe concerning for developing bronchopneumonia. Followup PA and lateral chest X-ray is recommended in 3-4 weeks following trial of antibiotic therapy to ensure resolution and exclude underlying malignancy. 2. Chronic elevation of the right hemidiaphragm. Electronically Signed   By: Vinnie Langton M.D.   On: 07/29/2021 09:21   DG Knee 1-2 Views  Right  Result Date: 07/28/2021 CLINICAL DATA:  Knee pain after fall. EXAM: RIGHT KNEE - 1-2 VIEW COMPARISON:  None. FINDINGS: No evidence of fracture, dislocation, or joint effusion. No evidence of arthropathy. Joint spaces are preserved. Tricompartmental tiny degenerative spurring. Soft tissues are unremarkable. IMPRESSION: No acute osseous abnormality. Electronically Signed   By: Dahlia Bailiff MD   On: 07/28/2021 18:10   DG Abdomen 1 View  Result Date: 07/28/2021 CLINICAL DATA:  MRI clearance. EXAM: ABDOMEN - 1 VIEW COMPARISON:  None. FINDINGS: No unexpected radiopaque foreign bodies are noted. A chronic appearing RIGHT femoral neck fracture is noted with superolateral displacement of at least 1.5 cm. No other bony abnormalities noted. IMPRESSION: 1. No unexpected radiopaque foreign bodies. 2. Chronic appearing RIGHT femoral neck fracture with superolateral displacement of at least 1.5 cm. Electronically Signed   By: Margarette Canada M.D.   On: 07/28/2021 15:54   CT HEAD WO CONTRAST  Result Date: 07/28/2021 CLINICAL DATA:  Neuro deficit, acute, stroke suspected. Unwitnessed fall now with facial droop, gait abnormality, right-sided weakness. EXAM: CT HEAD WITHOUT CONTRAST TECHNIQUE: Contiguous axial images were obtained from the base of the skull through the vertex without intravenous contrast. COMPARISON:  MRI 06/12/2018 FINDINGS: Brain: Extensive encephalomalacia involving the left cerebral hemisphere in keeping with remote left ACA/MCA territory infarct is again seen. There is marked ex vacuo dilatation of the left lateral ventricle which may communicate with the extra-axial space. Alternatively, this may represent a superimposed large left arachnoid cyst as described on prior MRI examination. Moderate asymmetric parenchymal atrophy involving the residual left occipital lobe. Evidence of wallerian degeneration involving the left thalamus and left cerebral peduncle. No evidence of acute intracranial hemorrhage  or infarct. No abnormal mass effect or midline shift. Borderline ventriculomegaly involving the right lateral ventricle likely reflects the sequela of mild central atrophy and is stable since prior examination. Cerebellum unremarkable. Vascular: No asymmetric hyperdense vasculature at the skull base. Skull: Intact Sinuses/Orbits: The paranasal sinuses are clear. The orbits are unremarkable. Other: The mastoid air cells and middle ear cavities are  clear. IMPRESSION: Stable changes of remote left ACA/MCA territory infarct with extensive left cerebral encephalomalacia which may communicate with the extra-axial space or abut a large left arachnoid cyst. No associated abnormal mass effect or midline shift. No evidence of acute intracranial hemorrhage or infarct. No calvarial fracture. Electronically Signed   By: Fidela Salisbury MD   On: 07/28/2021 13:04   CT Cervical Spine Wo Contrast  Result Date: 07/28/2021 CLINICAL DATA:  Poly trauma. EXAM: CT CERVICAL SPINE WITHOUT CONTRAST TECHNIQUE: Multidetector CT imaging of the cervical spine was performed without intravenous contrast. Multiplanar CT image reconstructions were also generated. COMPARISON:  None. FINDINGS: Alignment: Normal Skull base and vertebrae: No acute fracture. No primary bone lesion or focal pathologic process. Soft tissues and spinal canal: No prevertebral fluid or swelling. No visible canal hematoma. Disc levels: Degenerative cervical spondylosis with mild multilevel disc disease and facet disease. No significant disc protrusions, spinal or foraminal stenosis. Upper chest: The lung apices are grossly clear. Other: No neck mass or hematoma. IMPRESSION: 1. Normal alignment and no acute cervical spine fracture. 2. Degenerative cervical spondylosis with mild multilevel disc disease and facet disease. Electronically Signed   By: Marijo Sanes M.D.   On: 07/28/2021 15:00   MR BRAIN WO CONTRAST  Result Date: 07/28/2021 CLINICAL DATA:  Neuro deficit, acute,  stroke suspected. Facial droop and right-sided weakness. History of cerebral palsy. EXAM: MRI HEAD WITHOUT CONTRAST TECHNIQUE: Multiplanar, multiecho pulse sequences of the brain and surrounding structures were obtained without intravenous contrast. COMPARISON:  Head CT 07/28/2021 and MRI 06/12/2018 FINDINGS: The study is severely motion degraded. Brain: Axial diffusion weighted imaging is only mildly motion degraded, and there is no evidence of an acute infarct. Extensive left cerebral hemispheric encephalomalacia is again noted involving the frontal, parietal, and temporal lobes with poor in cephaly and severe wallerian degeneration with asymmetrically small left brainstem. Extra-axial CSF over the left cerebral hemisphere is unchanged from the prior MRI and may reflect in part an arachnoid cyst. The ventricles are unchanged in size from the prior MRI. The corpus callosum is chronically small. No gross mass or acute midline shift is identified. Vascular: Major intracranial vascular flow voids are grossly preserved. Skull and upper cervical spine: No gross skull lesion. Sinuses/Orbits: Paranasal sinuses and mastoid air cells are clear. Hyperpneumatization of the left frontal sinus and left mastoid air cells. Unremarkable orbits. Other: None. IMPRESSION: 1. Severely motion degraded examination without evidence of acute infarct. 2. Unchanged extensive left cerebral hemispheric encephalomalacia compatible with a perinatal insult. Electronically Signed   By: Logan Bores M.D.   On: 07/28/2021 18:02   DG Chest Portable 1 View  Result Date: 07/28/2021 CLINICAL DATA:  MRI clearance. EXAM: PORTABLE CHEST 1 VIEW COMPARISON:  06/12/2018 FINDINGS: No unexpected radiopaque foreign bodies are noted. UPPER limits normal heart size identified. Elevated RIGHT hemidiaphragm is present. There is no evidence of focal airspace disease, pulmonary edema, suspicious pulmonary nodule/mass, pleural effusion, or pneumothorax. No acute  bony abnormalities are identified. IMPRESSION: 1. No unexpected radiopaque foreign bodies. 2. No active disease. Electronically Signed   By: Margarette Canada M.D.   On: 07/28/2021 15:52   DG HIP UNILAT WITH PELVIS 2-3 VIEWS RIGHT  Result Date: 07/29/2021 CLINICAL DATA:  Fall yesterday, right hip pain EXAM: DG HIP (WITH OR WITHOUT PELVIS) 2-3V RIGHT COMPARISON:  Abdominal radiographs, 07/28/2021 FINDINGS: There is a displaced and slightly impacted basicervical fracture of the right femoral neck, which is unchanged compared to prior examination and subacute to chronic  appearing. No other fracture. IMPRESSION: There is a displaced and slightly impacted basicervical fracture of the right femoral neck, which is unchanged compared to prior examination and subacute to chronic appearing. No other fracture. Electronically Signed   By: Eddie Candle M.D.   On: 07/29/2021 09:20    Review of Systems  HENT:  Negative for ear discharge, ear pain, hearing loss and tinnitus.   Eyes:  Negative for photophobia and pain.  Respiratory:  Negative for cough and shortness of breath.   Cardiovascular:  Negative for chest pain.  Gastrointestinal:  Negative for abdominal pain, nausea and vomiting.  Genitourinary:  Negative for dysuria, flank pain, frequency and urgency.  Musculoskeletal:  Positive for arthralgias (Right knee). Negative for back pain, myalgias and neck pain.  Neurological:  Negative for dizziness and headaches.  Hematological:  Does not bruise/bleed easily.  Psychiatric/Behavioral:  The patient is not nervous/anxious.   Blood pressure 137/88, pulse 79, temperature 98.7 F (37.1 C), resp. rate 18, height 6' (1.829 m), weight 78.5 kg, SpO2 95 %. Physical Exam Constitutional:      General: He is not in acute distress.    Appearance: He is well-developed. He is not diaphoretic.  HENT:     Head: Normocephalic and atraumatic.  Eyes:     General: No scleral icterus.       Right eye: No discharge.        Left  eye: No discharge.     Conjunctiva/sclera: Conjunctivae normal.  Cardiovascular:     Rate and Rhythm: Normal rate and regular rhythm.  Pulmonary:     Effort: Pulmonary effort is normal. No respiratory distress.  Musculoskeletal:     Cervical back: Normal range of motion.     Comments: RLE No traumatic wounds, ecchymosis, or rash  Mod TTP knee, no TTP hip  No knee or ankle effusion  Knee stable to varus/ valgus and anterior/posterior stress but significant pain  Sens DPN, SPN, TN intact  Motor EHL, ext, flex, evers 2/5  DP 2+, PT 2+, No significant edema  Skin:    General: Skin is warm and dry.  Neurological:     Mental Status: He is alert.  Psychiatric:        Mood and Affect: Mood normal.        Behavior: Behavior normal.    Assessment/Plan: Right hip fx -- Will plan surgery with Dr. Marlou Sa or one of his partners, likely tomorrow. Right knee pain -- Will check MRI    Lisette Abu, PA-C Orthopedic Surgery [phone] 07/30/2021, 11:16 AM

## 2021-07-30 NOTE — TOC Initial Note (Signed)
Transition of Care [facility]) - Initial/Assessment Note    Patient Details  Name: Marvin Mahoney MRN: [id number] Date of Birth: 10-28-1961  [facility] of [facility]) CM/SW Contact:    Coralee Pesa, [place] Phone Number: 07/30/2021, 5:01 PM  Clinical Narrative:                  Spoke with pt's legal guardian, Marvin Mahoney with DSS. He is agreeable to SNF placement noting [facility] or [facility] as preferences. Pt has had two Pfizer shots, but no boosters. [facility] lives [facility] is for [facility] to return there when able. Pt currently still receiving medical treatment, and is not ready for DC yet. CSW will complete workup and faxout, follow up with guardian with any bed offers. TOC will continue to follow for DC needs.  Expected Discharge Plan: Skilled Nursing Facility Barriers to Discharge: Continued Medical Work up, SNF Pending bed offer   Patient Goals and CMS Choice Patient states their goals for this hospitalization and ongoing recovery are:: Pt unable to participate in goal setting. CMS Medicare.gov Compare Post Acute Care list provided to:: Legal Guardian Choice offered to / list presented to : [place] / Guardian  Expected Discharge Plan and Services Expected Discharge Plan: [place] Choice: [place] Living arrangements for the past 2 months: Group Home                                      Prior Living Arrangements/Services Living arrangements for the past 2 months: Group Home Lives with:: Facility Resident Patient language and need for interpreter reviewed:: Yes Do you feel safe going back to the place where you live?: Yes      Need for Family Participation in Patient Care: Yes (Comment) Care giver support system in place?: Yes (comment)   Criminal Activity/Legal Involvement Pertinent to Current Situation/Hospitalization: No - Comment as needed  Activities of Daily Living      Permission  Sought/Granted Permission sought to share information with : Guardian Permission granted to share information with : Yes, Verbal Permission Granted  Share Information with NAME: Marvin Mahoney     Permission granted to share info w Relationship: Legal Gaurdian  Permission granted to share info w Contact Information: [id number]  Emotional Assessment Appearance:: Appears stated age Attitude/Demeanor/Rapport: Unable to Assess Affect (typically observed): Unable to Assess Orientation: : Oriented to Self, Oriented to Place Alcohol / Substance Use: Not Applicable Psych Involvement: No (comment)  Admission diagnosis:  Hip fracture ([place]) [S72.009A] Weakness [R53.1] Fall [W19.XXXA] Right leg pain [M79.604] Knee pain, right [M25.561] Right sided weakness [R53.1] Stroke-like symptoms [R29.90] Femoral neck fracture ([place]) [S72.009A] Patient Active Problem List   Diagnosis Date Noted   CP (cerebral palsy) ([place]) 07/29/2021   Intellectual disability 07/29/2021   Intertrochanteric fracture of right hip ([place]) 07/29/2021   Fracture of femoral neck, right ([place]) 07/29/2021   Femoral neck fracture ([place]) 07/29/2021   Adjustment disorder with depressed mood 07/13/2021   GAD (generalized anxiety disorder) 07/13/2021   Insomnia 07/13/2021   Dementia ([place]) 03/08/2021   Hypertension 02/26/2017   Moderate protein-calorie malnutrition ([place]) 02/26/2017   PCP:  Pcp, No Pharmacy:   [facility], [place] [address] Phone: [phone] Fax: [phone]     Social  Determinants of Health (SDOH) Interventions    Readmission Risk Interventions No flowsheet data found.

## 2021-07-30 NOTE — NC FL2 (Signed)
[place] MEDICAID FL2 LEVEL OF CARE SCREENING TOOL     IDENTIFICATION  Patient Name: Marvin Mahoney Birthdate: Jan 16, 1961 Sex: male Admission Date (Current Location): 07/28/2021  [facility] and [place] Number:  Herbalist and Address:  [facility] [place], [place] [address] [place], [place] [address]      Provider Number: [id number]  Attending Physician Name and Address:  Axel Filler, *  Relative Name and Phone Number:  Barnie Alderman, Legal Guardan, [phone]    Current Level of Care: SNF Recommended Level of Care: [place] Prior Approval Number:    Date Approved/Denied:   PASRR Number:    Discharge Plan: SNF    Current Diagnoses: Patient Active Problem List   Diagnosis Date Noted   CP (cerebral palsy) ([place]) 07/29/2021   Intellectual disability 07/29/2021   Intertrochanteric fracture of right hip ([place]) 07/29/2021   Fracture of femoral neck, right ([place]) 07/29/2021   Femoral neck fracture ([place]) 07/29/2021   Adjustment disorder with depressed mood 07/13/2021   GAD (generalized anxiety disorder) 07/13/2021   Insomnia 07/13/2021   Dementia ([place]) 03/08/2021   Hypertension 02/26/2017   Moderate protein-calorie malnutrition ([place]) 02/26/2017    Orientation RESPIRATION BLADDER Height & Weight     Self, Place  Normal Incontinent, External catheter Weight: 173 lb 1 oz (78.5 kg) Height:  6' (182.9 cm)  BEHAVIORAL SYMPTOMS/MOOD NEUROLOGICAL BOWEL NUTRITION STATUS      Incontinent Diet (See DC summary)  AMBULATORY STATUS COMMUNICATION OF NEEDS Skin   Extensive Assist Verbally Normal                       Personal Care Assistance Level of Assistance  Bathing, Feeding, Dressing Bathing Assistance: Maximum assistance Feeding assistance: Limited assistance Dressing Assistance: Maximum assistance     Functional Limitations Info  Sight, Hearing, Speech Sight Info: Adequate Hearing Info: Adequate Speech Info: Adequate     SPECIAL CARE FACTORS FREQUENCY  PT (By licensed PT), OT (By licensed OT)     PT Frequency: 5x week OT Frequency: 5x week            Contractures Contractures Info: Not present    Additional Factors Info  Code Status, Allergies Code Status Info: Full Allergies Info: NKA           Current Medications (07/30/2021):  This is the current hospital active medication list Current Facility-Administered Medications  Medication Dose Route Frequency Provider Last Rate Last Admin   acetaminophen (TYLENOL) tablet 650 mg  650 mg Oral Q6H PRN Atway, Rayann N, DO   650 mg at 07/29/21 0456   Or   acetaminophen (TYLENOL) suppository 650 mg  650 mg Rectal Q6H PRN Atway, Rayann N, DO       aspirin EC tablet 81 mg  81 mg Oral Daily Atway, Rayann N, DO   81 mg at 07/30/21 1112   HYDROmorphone (DILAUDID) injection 0.5 mg  0.5 mg Intravenous Q4H PRN Atway, Rayann N, DO   0.5 mg at 07/30/21 1605   PARoxetine (PAXIL) tablet 60 mg  60 mg Oral QHS Atway, Rayann N, DO   60 mg at 07/29/21 2118   senna-docusate (Senokot-S) tablet 1 tablet  1 tablet Oral QHS PRN Atway, Rayann N, DO       tamsulosin (FLOMAX) capsule 0.4 mg  0.4 mg Oral q AM Atway, Rayann N, DO   0.4 mg at 07/29/21 [id number]     Discharge Medications: Please see  discharge summary for a list of discharge medications.  Relevant Imaging Results:  Relevant Lab Results:   Additional Information SS# [id number] Covid shots, no boosters  Coralee Pesa, LCSWA

## 2021-07-30 NOTE — Progress Notes (Addendum)
HD#2 SUBJECTIVE:  Patient Summary: Marvin Mahoney is a 60 y.o. with a pertinent PMH of HTN, cerebral palsy, prior stroke with residual right sided deficits, depression, and cognitive impairment who presented with new onset right sided weakness and admitted for right femoral neck fracture.  Overnight Events: No acute events overnight  Interim History: This is hospital day 2 for Marvin Mahoney who was seen and evaluated at the bedside this morning. He reports feeling "sore", but he does not appear to be in any distress. Denies any other complaints at this time.   OBJECTIVE:  Vital Signs: Vitals:   07/29/21 1030 07/29/21 1123 07/29/21 2112 07/30/21 0524  BP: (!) 160/107 (!) 148/87 (!) 130/97 137/88  Pulse: 81 78 83 79  Resp: '16 20 18 18  '$ Temp:  98.9 F (37.2 C)  98.7 F (37.1 C)  TempSrc:  Oral Oral   SpO2: 97% 99% 95% 95%  Weight:      Height:       Supplemental O2: Nasal Cannula SpO2: 95 % O2 Flow Rate (L/min): 2 L/min FiO2 (%): 100 %  Filed Weights   07/28/21 1603  Weight: 78.5 kg     Intake/Output Summary (Last 24 hours) at 07/30/2021 0645 Last data filed at 07/30/2021 0442 Gross per 24 hour  Intake --  Output 800 ml  Net -800 ml   Net IO Since Admission: -800 mL [07/30/21 0645]  Physical Exam: General: Chronically ill-appearing male laying in bed. No acute distress. CV: RRR. No murmurs, rubs, or gallops. No LE edema Pulmonary: Lungs CTAB. Normal effort. No wheezing or rales. Extremities: Palpable radial and DP pulses. Limited internal rotation of R lower extremity. R lower extremity is shortened and atrophied compared to left. Right arm contracture noted.  Skin: Warm and dry. No obvious rash or lesions. Neuro: A&O to self and place. Normal sensation. Limited shoulder shrug on R, unable to lift/move right upper extremity (which is chronic) and R lower extremity (new as of hospitalization).  Psych: Appropriate mood and affect  Patient Lines/Drains/Airways Status     Active  Line/Drains/Airways     Name Placement date Placement time Site Days   Peripheral IV 06/12/18 Left;Posterior Hand 06/12/18  1728  Hand  1144   Peripheral IV 07/28/21 20 G Posterior;Right Hand 07/28/21  1800  Hand  2   External Urinary Catheter 06/12/18  1715  --  1144   External Urinary Catheter 07/29/21  2116  --  1            Pertinent Labs: CBC Latest Ref Rng & Units 07/29/2021 07/28/2021 07/28/2021  WBC 4.0 - 10.5 K/uL 10.1 10.1 -  Hemoglobin 13.0 - 17.0 g/dL 12.3(L) 12.9(L) 12.9(L)  Hematocrit 39.0 - 52.0 % 37.1(L) 39.2 38.0(L)  Platelets 150 - 400 K/uL 179 187 -    CMP Latest Ref Rng & Units 07/29/2021 07/28/2021 07/28/2021  Glucose 70 - 99 mg/dL 115(H) 190(H) 188(H)  BUN 6 - 20 mg/dL '11 13 13  '$ Creatinine 0.61 - 1.24 mg/dL 0.77 0.80 0.92  Sodium 135 - 145 mmol/L 136 141 140  Potassium 3.5 - 5.1 mmol/L 3.7 3.5 3.5  Chloride 98 - 111 mmol/L 103 108 109  CO2 22 - 32 mmol/L 26 - 23  Calcium 8.9 - 10.3 mg/dL 8.4(L) - 8.7(L)  Total Protein 6.5 - 8.1 g/dL - - 6.2(L)  Total Bilirubin 0.3 - 1.2 mg/dL - - 1.0  Alkaline Phos 38 - 126 U/L - - 64  AST 15 - 41  U/L - - 23  ALT 0 - 44 U/L - - 14    Recent Labs    07/28/21 1224  GLUCAP 171*     ASSESSMENT/PLAN:   Assessment: Principal Problem:   Fracture of femoral neck, right (HCC) Active Problems:   CP (cerebral palsy) (HCC)   Dementia (HCC)   Moderate protein-calorie malnutrition (HCC)   Intertrochanteric fracture of right hip (HCC)   Femoral neck fracture (HCC)   Plan: #Right femoral neck fracture The patient sustained a fall on 8/5 and as of the morning of 8/6, the patient was unable to walk due to new right leg weakness. Patient found to have right femoral neck fracture on imaging. His pain is controlled at this time and he states that he just feels "sore" in his right leg and knee.  - Orthopedics consulted, appreciate recommendations  - OR tomorrow for hemiarthroplasty   - Right knee MRI  - Pain management with dilaudid  0.'5mg'$  q4 hours PRN, required 1 dose in the last 24 hrs - PT/OT eval and treat - Likely will need SNF placement upon discharge vs home health at facility  #Depression Continue home medications, risperidone and paroxetine.   Best Practice: Diet: Regular diet, NPO at midnight IVF: Fluids: none VTE: rivaroxaban '10mg'$  daily after surgery Code: Full Therapy Recs: SNF Contact: Legal Theora [id number] [phone] or [name] (caregiver) [phone] DISPO: Anticipated discharge to [place] facility pending clinical improvement and ortho recs  Signature: Buddy Duty, D.O.  Internal Medicine Resident, PGY-1 [facility]  Pager: [phone] 6:45 AM, 07/30/2021   Please contact the on call pager after 5 pm and on weekends at [phone].

## 2021-07-31 ENCOUNTER — Encounter (HOSPITAL_COMMUNITY)
Admission: EM | Disposition: A | Discharge status: Skilled Nursing Facility | Payer: Self-pay | Source: Home / Self Care | Attending: Student in an Organized Health Care Education/Training Program

## 2021-07-31 ENCOUNTER — Inpatient Hospital Stay (HOSPITAL_COMMUNITY): Payer: Medicare Other

## 2021-07-31 DIAGNOSIS — E440 Moderate protein-calorie malnutrition: Secondary | ICD-10-CM

## 2021-07-31 DIAGNOSIS — I69351 Hemiplegia and hemiparesis following cerebral infarction affecting right dominant side: Secondary | ICD-10-CM

## 2021-07-31 DIAGNOSIS — G809 Cerebral palsy, unspecified: Secondary | ICD-10-CM

## 2021-07-31 DIAGNOSIS — W19XXXA Unspecified fall, initial encounter: Secondary | ICD-10-CM

## 2021-07-31 DIAGNOSIS — F0390 Unspecified dementia without behavioral disturbance: Secondary | ICD-10-CM

## 2021-07-31 SURGERY — FIXATION, FEMUR, NECK, PERCUTANEOUS, USING SCREW
Anesthesia: Choice | Site: Hip | Laterality: Right

## 2021-07-31 MED ORDER — ACETAMINOPHEN 500 MG PO TABS
1000.0000 mg | ORAL_TABLET | Freq: Three times a day (TID) | ORAL | Status: DC
  Administered 2021-07-31 – 2021-08-04 (×10): 1000 mg via ORAL
  Filled 2021-07-31 (×9): qty 2

## 2021-07-31 MED ORDER — HYDROMORPHONE HCL 1 MG/ML IJ SOLN: 1.0000 mg | INTRAMUSCULAR | Status: DC | PRN

## 2021-07-31 MED ORDER — HYDROCODONE-ACETAMINOPHEN 5-325 MG PO TABS
1.0000 | ORAL_TABLET | Freq: Four times a day (QID) | ORAL | Status: DC | PRN
  Administered 2021-08-01 – 2021-08-03 (×3): 1 via ORAL
  Filled 2021-07-31 (×3): qty 1

## 2021-07-31 MED ORDER — HYDROMORPHONE HCL 1 MG/ML IJ SOLN
1.0000 mg | INTRAMUSCULAR | Status: DC | PRN
Start: 2021-07-31 — End: 2021-08-04
  Administered 2021-07-31 – 2021-08-03 (×7): 1 mg via INTRAVENOUS
  Filled 2021-07-31 (×8): qty 1

## 2021-07-31 NOTE — Progress Notes (Signed)
PT Cancellation Note  Patient Details Name: Marvin Mahoney MRN: [id number] DOB: [birth year]   Cancelled Treatment:    Reason Eval/Treat Not Completed: Medical issues which prohibited therapy Noted plan is now for hip surgery tomorrow.  Also, attempted to get MRI of knee this am but pt screaming and unable.  Pt not appropriate for PT today, spoke with RN who confirmed.  Will f/u after surgery.  Abran Richard, PT Acute Rehab Services Pager [phone] [facility] [phone]  Karlton Lemon 07/31/2021, 11:36 AM

## 2021-07-31 NOTE — Progress Notes (Signed)
Attempted MRI of pt's knee.  Pt was yelling and screaming entire time and wanted to stop.   Pt had meds prior and was not able to have more at this time.

## 2021-07-31 NOTE — Progress Notes (Signed)
HD#3 SUBJECTIVE:  Patient Summary: Marvin Mahoney is a 60 y.o. with a pertinent PMH of HTN, cerebral palsy, prior stroke with residual right sided deficits, depression, and cognitive impairment who presented with new onset right sided weakness and admitted for right femoral neck fracture  Overnight Events: No acute events overnight.  Interim History: This is hospital day 3 for Marvin Mahoney who was seen and evaluated at the bedside this morning. He still feels sore and is mostly having pain in his right knee today.   OBJECTIVE:  Vital Signs: Vitals:   07/29/21 2112 07/30/21 0524 07/30/21 1602 07/30/21 2009  BP: (!) 130/97 137/88 (!) 143/89 (!) 148/94  Pulse: 83 79 76 85  Resp: '18 18 20 18  '$ Temp:  98.7 F (37.1 C) 98.2 F (36.8 C) 98.5 F (36.9 C)  TempSrc: Oral  Oral Oral  SpO2: 95% 95% 96% 94%  Weight:      Height:       Supplemental O2: Nasal Cannula SpO2: 94 % O2 Flow Rate (L/min): 2 L/min FiO2 (%): 100 %  Filed Weights   07/28/21 1603  Weight: 78.5 kg    No intake or output data in the 24 hours ending 07/31/21 0743 Net IO Since Admission: -800 mL [07/31/21 0743]  Physical Exam: General: Chronically ill-appearing male laying in bed. No acute distress, but appears to be in pain. CV: RRR. No murmurs, rubs, or gallops. No LE edema Pulmonary: Lungs CTAB. Normal effort. No wheezing or rales. Extremities: Palpable radial and DP pulses. Limited internal rotation of R lower extremity. R lower extremity is shortened and atrophied compared to left. Right arm contracture noted. R knee tender to palpation, but no signs of joint effusion noted Skin: Warm and dry. No obvious rash or lesions. Neuro: A&O to self and place. Normal sensation. Limited shoulder shrug on R, unable to lift/move right upper extremity (which is chronic) and R lower extremity (new as of hospitalization).  Psych: Appropriate mood and affect    ASSESSMENT/PLAN:  Assessment: Principal Problem:   Fracture of  femoral neck, right (HCC) Active Problems:   CP (cerebral palsy) (HCC)   Dementia (HCC)   Moderate protein-calorie malnutrition (HCC)   Intertrochanteric fracture of right hip (HCC)   Femoral neck fracture (HCC)   Plan: #Right femoral neck fracture The patient sustained a fall on 8/5 and as of the morning of 8/6, the patient was unable to walk due to new right leg weakness. His pain is not well controlled at this time and he states that he just feels "sore" in his right leg, specifically in his knee. This is likely referred pain from his hip fracture. Right knee MRI unable to be completed today as the patient was in a significant amount of pain. - Orthopedics consulted, appreciate recommendations  - OR on Wednesday for hemiarthroplasty vs total with ortho - Pain management with dilaudid increased from 0.'5mg'$  to '1mg'$  q4 hours PRN - PT/OT eval and treat - Likely will need SNF placement upon discharge vs home health at facility   Best Practice: Diet: Regular diet, NPO at midnight IVF: Fluids: none VTE: rivaroxaban '10mg'$  daily after surgery Code: Full Therapy Recs: SNF Contact: Legal Marvin [id number] [phone] or Marvin Mahoney (caregiver) [phone] DISPO: Anticipated discharge to [place] facility pending clinical improvement and ortho recs  Signature: Buddy Duty, D.O.  Internal Medicine Resident, PGY-1 [facility]  Pager: [phone] 7:43 AM, 07/31/2021   Please contact the on call pager after 5 pm  and on weekends at [phone].

## 2021-08-01 ENCOUNTER — Inpatient Hospital Stay (HOSPITAL_COMMUNITY): Payer: Medicare Other | Admitting: Anesthesiology

## 2021-08-01 ENCOUNTER — Inpatient Hospital Stay (HOSPITAL_COMMUNITY): Payer: Medicare Other

## 2021-08-01 ENCOUNTER — Encounter (HOSPITAL_COMMUNITY): Payer: Self-pay | Admitting: Student in an Organized Health Care Education/Training Program

## 2021-08-01 ENCOUNTER — Encounter (HOSPITAL_COMMUNITY)
Admission: EM | Disposition: A | Discharge status: Skilled Nursing Facility | Payer: Self-pay | Source: Home / Self Care | Attending: Student in an Organized Health Care Education/Training Program

## 2021-08-01 DIAGNOSIS — S72001A Fracture of unspecified part of neck of right femur, initial encounter for closed fracture: Secondary | ICD-10-CM | POA: Diagnosis not present

## 2021-08-01 HISTORY — PX: TOTAL HIP ARTHROPLASTY: SHX124

## 2021-08-01 LAB — SURGICAL PCR SCREEN
MRSA, PCR: NEGATIVE
Staphylococcus aureus: NEGATIVE

## 2021-08-01 LAB — CBC
HCT: 38 % — ABNORMAL LOW (ref 39.0–52.0)
Hemoglobin: 12.8 g/dL — ABNORMAL LOW (ref 13.0–17.0)
MCH: 31.4 pg (ref 26.0–34.0)
MCHC: 33.7 g/dL (ref 30.0–36.0)
MCV: 93.1 fL (ref 80.0–100.0)
Platelets: 252 10*3/uL (ref 150–400)
RBC: 4.08 MIL/uL — ABNORMAL LOW (ref 4.22–5.81)
RDW: 12.4 % (ref 11.5–15.5)
WBC: 8.1 10*3/uL (ref 4.0–10.5)
nRBC: 0 % (ref 0.0–0.2)

## 2021-08-01 LAB — BASIC METABOLIC PANEL
Anion gap: 6 (ref 5–15)
BUN: 18 mg/dL (ref 6–20)
CO2: 30 mmol/L (ref 22–32)
Calcium: 8.5 mg/dL — ABNORMAL LOW (ref 8.9–10.3)
Chloride: 102 mmol/L (ref 98–111)
Creatinine, Ser: 0.87 mg/dL (ref 0.61–1.24)
GFR, Estimated: >60 mL/min (ref >60)
Glucose, Bld: 114 mg/dL — ABNORMAL HIGH (ref 70–99)
Potassium: 3.8 mmol/L (ref 3.5–5.1)
Sodium: 138 mmol/L (ref 135–145)

## 2021-08-01 SURGERY — ARTHROPLASTY, HIP, TOTAL, ANTERIOR APPROACH
Anesthesia: General | Site: Hip | Laterality: Right

## 2021-08-01 MED ORDER — LIDOCAINE 2% (20 MG/ML) 5 ML SYRINGE
INTRAMUSCULAR | Status: DC | PRN
  Administered 2021-08-01: 100 mg via INTRAVENOUS

## 2021-08-01 MED ORDER — ROCURONIUM BROMIDE 10 MG/ML (PF) SYRINGE
PREFILLED_SYRINGE | INTRAVENOUS | Status: DC | PRN
  Administered 2021-08-01: 10 mg via INTRAVENOUS
  Administered 2021-08-01 (×2): 20 mg via INTRAVENOUS
  Administered 2021-08-01: 50 mg via INTRAVENOUS

## 2021-08-01 MED ORDER — CHLORHEXIDINE GLUCONATE 0.12 % MT SOLN
OROMUCOSAL | Status: AC
  Administered 2021-08-01: 15 mL via OROMUCOSAL
  Filled 2021-08-01: qty 15

## 2021-08-01 MED ORDER — 0.9 % SODIUM CHLORIDE (POUR BTL) OPTIME
TOPICAL | Status: DC | PRN
  Administered 2021-08-01: 1000 mL

## 2021-08-01 MED ORDER — FENTANYL CITRATE (PF) 100 MCG/2ML IJ SOLN
25.0000 ug | INTRAMUSCULAR | Status: DC | PRN
  Administered 2021-08-01: 25 ug via INTRAVENOUS
  Administered 2021-08-01: 50 ug via INTRAVENOUS

## 2021-08-01 MED ORDER — TRANEXAMIC ACID 1000 MG/10ML IV SOLN
INTRAVENOUS | Status: DC | PRN
  Administered 2021-08-01: 2000 mg via TOPICAL

## 2021-08-01 MED ORDER — FENTANYL CITRATE (PF) 250 MCG/5ML IJ SOLN
INTRAMUSCULAR | Status: DC | PRN
  Administered 2021-08-01 (×5): 50 ug via INTRAVENOUS

## 2021-08-01 MED ORDER — LACTATED RINGERS IV SOLN: INTRAVENOUS | Status: DC

## 2021-08-01 MED ORDER — PROPOFOL 10 MG/ML IV BOLUS
INTRAVENOUS | Status: AC
  Filled 2021-08-01: qty 20

## 2021-08-01 MED ORDER — IRRISEPT - 450ML BOTTLE WITH 0.05% CHG IN STERILE WATER, USP 99.95% OPTIME
TOPICAL | Status: DC | PRN
  Administered 2021-08-01: 450 mL via TOPICAL

## 2021-08-01 MED ORDER — MIDAZOLAM HCL 2 MG/2ML IJ SOLN
INTRAMUSCULAR | Status: DC | PRN
  Administered 2021-08-01: 1 mg via INTRAVENOUS

## 2021-08-01 MED ORDER — POVIDONE-IODINE 10 % EX SWAB
2.0000 "application " | Freq: Once | CUTANEOUS | Status: AC
  Administered 2021-08-01: 2 via TOPICAL

## 2021-08-01 MED ORDER — PHENYLEPHRINE 40 MCG/ML (10ML) SYRINGE FOR IV PUSH (FOR BLOOD PRESSURE SUPPORT)
PREFILLED_SYRINGE | INTRAVENOUS | Status: AC
  Filled 2021-08-01: qty 10

## 2021-08-01 MED ORDER — FENTANYL CITRATE (PF) 250 MCG/5ML IJ SOLN
INTRAMUSCULAR | Status: AC
  Filled 2021-08-01: qty 5

## 2021-08-01 MED ORDER — DEXAMETHASONE SODIUM PHOSPHATE 10 MG/ML IJ SOLN
INTRAMUSCULAR | Status: DC | PRN
  Administered 2021-08-01: 10 mg via INTRAVENOUS

## 2021-08-01 MED ORDER — SODIUM CHLORIDE 0.9 % IR SOLN
Status: DC | PRN
  Administered 2021-08-01: 3000 mL

## 2021-08-01 MED ORDER — ONDANSETRON HCL 4 MG/2ML IJ SOLN
INTRAMUSCULAR | Status: DC | PRN
  Administered 2021-08-01: 4 mg via INTRAVENOUS

## 2021-08-01 MED ORDER — FENTANYL CITRATE (PF) 100 MCG/2ML IJ SOLN
50.0000 ug | Freq: Once | INTRAMUSCULAR | Status: AC
  Administered 2021-08-01: 50 ug via INTRAVENOUS

## 2021-08-01 MED ORDER — ROCURONIUM BROMIDE 10 MG/ML (PF) SYRINGE
PREFILLED_SYRINGE | INTRAVENOUS | Status: AC
  Filled 2021-08-01: qty 10

## 2021-08-01 MED ORDER — FENTANYL CITRATE (PF) 100 MCG/2ML IJ SOLN: 50.0000 ug | Freq: Once | INTRAMUSCULAR | Status: AC

## 2021-08-01 MED ORDER — FENTANYL CITRATE (PF) 100 MCG/2ML IJ SOLN
INTRAMUSCULAR | Status: AC
  Filled 2021-08-01: qty 2

## 2021-08-01 MED ORDER — CHLORHEXIDINE GLUCONATE 0.12 % MT SOLN: 15.0000 mL | Freq: Once | OROMUCOSAL | Status: AC

## 2021-08-01 MED ORDER — SUGAMMADEX SODIUM 200 MG/2ML IV SOLN
INTRAVENOUS | Status: DC | PRN
  Administered 2021-08-01: 200 mg via INTRAVENOUS

## 2021-08-01 MED ORDER — RIVAROXABAN 10 MG PO TABS
10.0000 mg | ORAL_TABLET | Freq: Every day | ORAL | Status: DC
  Administered 2021-08-02 – 2021-08-04 (×3): 10 mg via ORAL
  Filled 2021-08-01 (×3): qty 1

## 2021-08-01 MED ORDER — PROPOFOL 10 MG/ML IV BOLUS
INTRAVENOUS | Status: DC | PRN
  Administered 2021-08-01: 100 mg via INTRAVENOUS

## 2021-08-01 MED ORDER — ORAL CARE MOUTH RINSE: 15.0000 mL | Freq: Once | OROMUCOSAL | Status: AC

## 2021-08-01 MED ORDER — ACETAMINOPHEN 325 MG PO TABS: 325.0000 mg | ORAL_TABLET | ORAL | Status: DC | PRN

## 2021-08-01 MED ORDER — ONDANSETRON HCL 4 MG/2ML IJ SOLN
INTRAMUSCULAR | Status: AC
  Filled 2021-08-01: qty 2

## 2021-08-01 MED ORDER — VANCOMYCIN HCL 1 G IV SOLR
INTRAVENOUS | Status: DC | PRN
  Administered 2021-08-01: 1000 mg via TOPICAL

## 2021-08-01 MED ORDER — OXYCODONE HCL 5 MG PO TABS: 5.0000 mg | ORAL_TABLET | Freq: Once | ORAL | Status: DC | PRN

## 2021-08-01 MED ORDER — BUPIVACAINE-MELOXICAM ER 400-12 MG/14ML IJ SOLN
INTRAMUSCULAR | Status: DC | PRN
  Administered 2021-08-01: 12 mL

## 2021-08-01 MED ORDER — CHLORHEXIDINE GLUCONATE 4 % EX LIQD: 60.0000 mL | Freq: Once | CUTANEOUS | Status: DC

## 2021-08-01 MED ORDER — MIDAZOLAM HCL 2 MG/2ML IJ SOLN
INTRAMUSCULAR | Status: AC
  Filled 2021-08-01: qty 2

## 2021-08-01 MED ORDER — CHLORHEXIDINE GLUCONATE CLOTH 2 % EX PADS
6.0000 | MEDICATED_PAD | Freq: Every day | CUTANEOUS | Status: DC
  Administered 2021-08-02 – 2021-08-04 (×3): 6 via TOPICAL

## 2021-08-01 MED ORDER — DEXAMETHASONE SODIUM PHOSPHATE 10 MG/ML IJ SOLN
INTRAMUSCULAR | Status: AC
  Filled 2021-08-01: qty 1

## 2021-08-01 MED ORDER — OXYCODONE HCL 5 MG/5ML PO SOLN: 5.0000 mg | Freq: Once | ORAL | Status: DC | PRN

## 2021-08-01 MED ORDER — TRANEXAMIC ACID-NACL 1000-0.7 MG/100ML-% IV SOLN
1000.0000 mg | INTRAVENOUS | Status: AC
  Administered 2021-08-01: 1000 mg via INTRAVENOUS
  Filled 2021-08-01: qty 100

## 2021-08-01 MED ORDER — FENTANYL CITRATE (PF) 100 MCG/2ML IJ SOLN
INTRAMUSCULAR | Status: AC
  Administered 2021-08-01: 50 ug via INTRAVENOUS
  Filled 2021-08-01: qty 2

## 2021-08-01 MED ORDER — BUPIVACAINE-MELOXICAM ER 400-12 MG/14ML IJ SOLN
INTRAMUSCULAR | Status: AC
  Filled 2021-08-01: qty 1

## 2021-08-01 MED ORDER — PHENYLEPHRINE 40 MCG/ML (10ML) SYRINGE FOR IV PUSH (FOR BLOOD PRESSURE SUPPORT)
PREFILLED_SYRINGE | INTRAVENOUS | Status: DC | PRN
  Administered 2021-08-01 (×2): 80 ug via INTRAVENOUS

## 2021-08-01 MED ORDER — ONDANSETRON HCL 4 MG/2ML IJ SOLN: 4.0000 mg | Freq: Once | INTRAMUSCULAR | Status: DC | PRN

## 2021-08-01 MED ORDER — LIDOCAINE 2% (20 MG/ML) 5 ML SYRINGE
INTRAMUSCULAR | Status: AC
  Filled 2021-08-01: qty 5

## 2021-08-01 MED ORDER — ACETAMINOPHEN 160 MG/5ML PO SOLN: 325.0000 mg | ORAL | Status: DC | PRN

## 2021-08-01 MED ORDER — MEPERIDINE HCL 25 MG/ML IJ SOLN: 6.2500 mg | INTRAMUSCULAR | Status: DC | PRN

## 2021-08-01 MED ORDER — ACETAMINOPHEN 10 MG/ML IV SOLN
INTRAVENOUS | Status: AC
  Filled 2021-08-01: qty 100

## 2021-08-01 MED ORDER — VANCOMYCIN HCL 1000 MG IV SOLR
INTRAVENOUS | Status: AC
  Filled 2021-08-01: qty 1000

## 2021-08-01 MED ORDER — CEFAZOLIN SODIUM-DEXTROSE 2-4 GM/100ML-% IV SOLN
2.0000 g | INTRAVENOUS | Status: AC
  Administered 2021-08-01: 2 g via INTRAVENOUS
  Filled 2021-08-01: qty 100

## 2021-08-01 MED ORDER — TRANEXAMIC ACID 1000 MG/10ML IV SOLN
2000.0000 mg | INTRAVENOUS | Status: DC
  Filled 2021-08-01 (×2): qty 20

## 2021-08-01 SURGICAL SUPPLY — 64 items
BAG COUNTER SPONGE SURGICOUNT (BAG) ×2 IMPLANT
BAG DECANTER FOR FLEXI CONT (MISCELLANEOUS) ×2 IMPLANT
BIPOLAR DEPUY 49 (Hips) ×2 IMPLANT
CANISTER WOUNDNEG PRESSURE 500 (CANNISTER) ×1 IMPLANT
CELLS DAT CNTRL 66122 CELL SVR (MISCELLANEOUS) IMPLANT
COVER PERINEAL POST (MISCELLANEOUS) ×2 IMPLANT
COVER SURGICAL LIGHT HANDLE (MISCELLANEOUS) ×2 IMPLANT
DRAPE C-ARM 42X72 X-RAY (DRAPES) ×2 IMPLANT
DRAPE POUCH INSTRU U-SHP 10X18 (DRAPES) ×2 IMPLANT
DRAPE STERI IOBAN 125X83 (DRAPES) ×2 IMPLANT
DRAPE U-SHAPE 47X51 STRL (DRAPES) ×4 IMPLANT
DRESSING PEEL AND PLAC PRVNA20 (GAUZE/BANDAGES/DRESSINGS) IMPLANT
DRSG AQUACEL AG ADV 3.5X10 (GAUZE/BANDAGES/DRESSINGS) ×2 IMPLANT
DRSG PEEL AND PLACE PREVENA 20 (GAUZE/BANDAGES/DRESSINGS) ×2
DURAPREP 26ML APPLICATOR (WOUND CARE) ×4 IMPLANT
ELECT BLADE 4.0 EZ CLEAN MEGAD (MISCELLANEOUS) ×2
ELECT REM PT RETURN 9FT ADLT (ELECTROSURGICAL) ×2
ELECTRODE BLDE 4.0 EZ CLN MEGD (MISCELLANEOUS) ×1 IMPLANT
ELECTRODE REM PT RTRN 9FT ADLT (ELECTROSURGICAL) ×1 IMPLANT
GLOVE SURG LTX SZ7 (GLOVE) ×4 IMPLANT
GLOVE SURG NEOP MICRO LF SZ7.5 (GLOVE) ×2 IMPLANT
GLOVE SURG SYN 7.5  E (GLOVE) ×4
GLOVE SURG SYN 7.5 E (GLOVE) ×4 IMPLANT
GLOVE SURG SYN 7.5 PF PI (GLOVE) ×4 IMPLANT
GLOVE SURG UNDER POLY LF SZ7 (GLOVE) ×10 IMPLANT
GOWN STRL REIN XL XLG (GOWN DISPOSABLE) ×2 IMPLANT
GOWN STRL REUS W/ TWL LRG LVL3 (GOWN DISPOSABLE) IMPLANT
GOWN STRL REUS W/ TWL XL LVL3 (GOWN DISPOSABLE) ×1 IMPLANT
GOWN STRL REUS W/TWL LRG LVL3 (GOWN DISPOSABLE)
GOWN STRL REUS W/TWL XL LVL3 (GOWN DISPOSABLE) ×1
HANDPIECE INTERPULSE COAX TIP (DISPOSABLE) ×1
HEAD BIPOLAR DEPUY 49 (Hips) IMPLANT
HEAD FEM STD 28X+1.5 STRL (Hips) ×1 IMPLANT
HOOD PEEL AWAY FLYTE STAYCOOL (MISCELLANEOUS) ×4 IMPLANT
IV NS IRRIG 3000ML ARTHROMATIC (IV SOLUTION) ×2 IMPLANT
JET LAVAGE IRRISEPT WOUND (IRRIGATION / IRRIGATOR) ×2
KIT BASIN OR (CUSTOM PROCEDURE TRAY) ×2 IMPLANT
KIT DRSG PREVENA PLUS 7DAY 125 (MISCELLANEOUS) ×1 IMPLANT
LAVAGE JET IRRISEPT WOUND (IRRIGATION / IRRIGATOR) ×1 IMPLANT
MARKER SKIN DUAL TIP RULER LAB (MISCELLANEOUS) ×2 IMPLANT
NDL SPNL 18GX3.5 QUINCKE PK (NEEDLE) ×1 IMPLANT
NEEDLE SPNL 18GX3.5 QUINCKE PK (NEEDLE) ×2 IMPLANT
PACK TOTAL JOINT (CUSTOM PROCEDURE TRAY) ×2 IMPLANT
PACK UNIVERSAL I (CUSTOM PROCEDURE TRAY) ×2 IMPLANT
RETRACTOR WND ALEXIS 18 MED (MISCELLANEOUS) IMPLANT
RTRCTR WOUND ALEXIS 18CM MED (MISCELLANEOUS)
SAW OSC TIP CART 19.5X105X1.3 (SAW) ×2 IMPLANT
SET HNDPC FAN SPRY TIP SCT (DISPOSABLE) ×1 IMPLANT
STAPLER VISISTAT 35W (STAPLE) IMPLANT
STEM FEMORAL SZ6 HIGH ACTIS (Stem) ×1 IMPLANT
SUT ETHIBOND 2 V 37 (SUTURE) ×2 IMPLANT
SUT ETHILON 2 0 FS 18 (SUTURE) ×3 IMPLANT
SUT VIC AB 0 CT1 27 (SUTURE) ×1
SUT VIC AB 0 CT1 27XBRD ANBCTR (SUTURE) ×1 IMPLANT
SUT VIC AB 1 CTX 36 (SUTURE) ×1
SUT VIC AB 1 CTX36XBRD ANBCTR (SUTURE) ×1 IMPLANT
SUT VIC AB 2-0 CT1 27 (SUTURE) ×2
SUT VIC AB 2-0 CT1 TAPERPNT 27 (SUTURE) ×2 IMPLANT
SYR 50ML LL SCALE MARK (SYRINGE) ×2 IMPLANT
TOWEL GREEN STERILE (TOWEL DISPOSABLE) ×2 IMPLANT
TRAY CATH 16FR W/PLASTIC CATH (SET/KITS/TRAYS/PACK) IMPLANT
TRAY FOLEY W/BAG SLVR 16FR (SET/KITS/TRAYS/PACK) ×1
TRAY FOLEY W/BAG SLVR 16FR ST (SET/KITS/TRAYS/PACK) ×1 IMPLANT
YANKAUER SUCT BULB TIP NO VENT (SUCTIONS) ×2 IMPLANT

## 2021-08-01 NOTE — Anesthesia Procedure Notes (Signed)
Procedure Name: Intubation Date/Time: 08/01/2021 2:28 PM Performed by: Carolan Clines, CRNA Pre-anesthesia Checklist: Patient identified, Emergency Drugs available, Suction available and Patient being monitored Patient Re-evaluated:Patient Re-evaluated prior to induction Oxygen Delivery Method: Circle System Utilized Preoxygenation: Pre-oxygenation with 100% oxygen Induction Type: IV induction Ventilation: Mask ventilation without difficulty and Oral airway inserted - appropriate to patient size Laryngoscope Size: Mac and 4 Grade View: Grade I Tube type: Oral Tube size: 7.5 mm Number of attempts: 1 Airway Equipment and Method: Stylet and Oral airway Placement Confirmation: ETT inserted through vocal cords under direct vision, positive ETCO2 and breath sounds checked- equal and bilateral Secured at: 23 cm Tube secured with: Tape Dental Injury: Teeth and Oropharynx as per pre-operative assessment

## 2021-08-01 NOTE — Anesthesia Postprocedure Evaluation (Signed)
Anesthesia Post Note  Patient: Keygan Schmader  Procedure(s) Performed: RIGHT HIP HEMI  ARTHROPLASTY ANTERIOR APPROACH (Right: Hip)     Patient location during evaluation: PACU Anesthesia Type: General Level of consciousness: sedated and patient cooperative Pain management: pain level controlled Vital Signs Assessment: post-procedure vital signs reviewed and stable Respiratory status: spontaneous breathing Cardiovascular status: stable Anesthetic complications: no   No notable events documented.  Last Vitals:  Vitals:   08/01/21 1655 08/01/21 1729  BP: 133/87 (!) 135/95  Pulse: 89 90  Resp: 16 13  Temp: 36.6 C 36.7 C  SpO2: 94% 97%    Last Pain:  Vitals:   08/01/21 1742  TempSrc:   PainSc: 10-Worst pain ever                 Nolon Nations

## 2021-08-01 NOTE — Progress Notes (Signed)
OT Cancellation Note  Patient Details Name: Marvin Mahoney MRN: [id number] DOB: 1961/05/29   Cancelled Treatment:    Reason Eval/Treat Not Completed: Patient at procedure or test/ unavailable. Pt scheduled for hip surgery today. Will follow up later date.    Ramond Dial, OT/L   Acute OT Clinical Specialist Acute Rehabilitation Services Pager [phone] Office [phone]  08/01/2021, 8:26 AM

## 2021-08-01 NOTE — Progress Notes (Signed)
PT Cancellation Note  Patient Details Name: Marvin Mahoney MRN: [id number] DOB: Mar 26, 1961   Cancelled Treatment:    Reason Eval/Treat Not Completed: Medical issues which prohibited therapy  Pt scheduled for hip surgery today, will follow at later date. Abran Richard, PT Acute Rehab Services Pager [phone] [facility] [place] 08/01/2021, 10:12 AM

## 2021-08-01 NOTE — Anesthesia Preprocedure Evaluation (Addendum)
Anesthesia Evaluation  Patient identified by MRN, date of birth, ID band Patient awake    Reviewed: Allergy & Precautions, NPO status , Patient's Chart, lab work & pertinent test results  Airway Mallampati: II  TM Distance: >3 FB Neck ROM: Full    Dental  (+) Dental Advisory Given   Pulmonary neg pulmonary ROS, former smoker,    Pulmonary exam normal breath sounds clear to auscultation       Cardiovascular hypertension, Pt. on medications Normal cardiovascular exam Rhythm:Regular Rate:Normal     Neuro/Psych PSYCHIATRIC DISORDERS Anxiety Dementia CVA    GI/Hepatic negative GI ROS, Neg liver ROS,   Endo/Other  negative endocrine ROS  Renal/GU negative Renal ROS     Musculoskeletal negative musculoskeletal ROS (+)   Abdominal   Peds  Hematology negative hematology ROS (+)   Anesthesia Other Findings   Reproductive/Obstetrics                            Anesthesia Physical Anesthesia Plan  ASA: 3  Anesthesia Plan: General   Post-op Pain Management:    Induction: Intravenous  PONV Risk Score and Plan: 3 and Ondansetron, Dexamethasone, Treatment may vary due to age or medical condition and Midazolam  Airway Management Planned: Oral ETT  Additional Equipment: None  Intra-op Plan:   Post-operative Plan: Extubation in OR  Informed Consent: I have reviewed the patients History and Physical, chart, labs and discussed the procedure including the risks, benefits and alternatives for the proposed anesthesia with the patient or authorized representative who has indicated his/her understanding and acceptance.     Dental advisory given  Plan Discussed with: CRNA  Anesthesia Plan Comments:        Anesthesia Quick Evaluation

## 2021-08-01 NOTE — Transfer of Care (Signed)
Immediate Anesthesia Transfer of Care Note  Patient: Edem Suen  Procedure(s) Performed: RIGHT HIP HEMI  ARTHROPLASTY ANTERIOR APPROACH (Right: Hip)  Patient Location: PACU  Anesthesia Type:General  Level of Consciousness: awake and alert   Airway & Oxygen Therapy: Patient Spontanous Breathing and Patient connected to face mask oxygen  Post-op Assessment: Report given to RN and Post -op Vital signs reviewed and stable  Post vital signs: Reviewed and stable  Last Vitals:  Vitals Value Taken Time  BP 154/90   Temp    Pulse 102 08/01/21 1626  Resp 19 08/01/21 1626  SpO2 93 % 08/01/21 1626  Vitals shown include unvalidated device data.  Last Pain:  Vitals:   08/01/21 1336  TempSrc: Oral  PainSc:       Patients Stated Pain Goal: 0 ([phone] [id number])  Complications: No notable events documented.

## 2021-08-01 NOTE — TOC Progression Note (Deleted)
Transition of Care [facility]) - Progression Note    Patient Details  Name: Marvin Mahoney MRN: [id number] Date of Birth: December 13, 1961  Transition of Care [facility]) CM/SW [place], RN Phone [phone]  08/01/2021, 8:30 AM  Clinical Narrative:    To Whom It May Concern:   Please be advised that the above-named patient will require a short-term Nursing home stat - anticipated 30 days or less for rehabilitation and strengthening. The plan is for return home.   Expected Discharge Plan: [place] Barriers to Discharge: Continued Medical Work up, SNF Pending bed offer  Expected Discharge Plan and Services Expected Discharge Plan: [place] Choice: [place] arrangements for the past 2 months: Group Home                                       Social Determinants of Health (SDOH) Interventions    Readmission Risk Interventions No flowsheet data found.

## 2021-08-01 NOTE — Progress Notes (Signed)
HD#4 SUBJECTIVE:  Patient Summary: Marvin Mahoney is a 60 y.o. with a pertinent PMH of HTN, cerebral palsy, prior stroke with residual right sided deficits, depression, and cognitive impairment who presented with new onset right sided weakness and admitted for right femoral neck fracture  Overnight Events: No acute events overnight  Interim History: This is hospital day 4 for Marvin Mahoney who was seen and evaluated at the bedside this morning. He still feels sore and complains of pain in his right leg. Discussed that he is going to surgery today with ortho and he will hopefully have some improvement in his pain soon.   OBJECTIVE:  Vital Signs: Vitals:   07/30/21 1602 07/30/21 2009 07/31/21 2120 08/01/21 0340  BP: (!) 143/89 (!) 148/94 112/72 108/67  Pulse: 76 85 75 67  Resp: '20 18 18 14  '$ Temp: 98.2 F (36.8 C) 98.5 F (36.9 C) 98.1 F (36.7 C) 97.7 F (36.5 C)  TempSrc: Oral Oral Oral Oral  SpO2: 96% 94% 94% 94%  Weight:      Height:       Supplemental O2: Nasal Cannula SpO2: 94 % O2 Flow Rate (L/min): 2 L/min FiO2 (%): 100 %  Filed Weights   07/28/21 1603  Weight: 78.5 kg     Intake/Output Summary (Last 24 hours) at 08/01/2021 0604 Last data filed at 08/01/2021 0300 Gross per 24 hour  Intake --  Output 400 ml  Net -400 ml   Net IO Since Admission: -1,200 mL [08/01/21 0604]  Physical Exam: General: Chronically ill-appearing male laying in bed. No acute distress, but appears to be in pain. CV: RRR. No murmurs, rubs, or gallops. No LE edema Pulmonary: Lungs CTAB. Normal effort. No wheezing or rales. Extremities: Palpable radial and DP pulses. Limited internal rotation of R lower extremity. R lower extremity is shortened and atrophied compared to left (appears shorter today, could be 2/2 to position of patient on exam). Right arm contracture noted. R knee tender to palpation, but no signs of joint effusion noted Skin: Warm and dry. No obvious rash or lesions. Neuro: A&O to  self and place. Normal sensation. Psych: Appropriate mood and affect   ASSESSMENT/PLAN:  Assessment: Principal Problem:   Fracture of femoral neck, right (HCC) Active Problems:   CP (cerebral palsy) (HCC)   Dementia (HCC)   Moderate protein-calorie malnutrition (HCC)   Intertrochanteric fracture of right hip (HCC)   Femoral neck fracture (HCC)   Plan: #Right femoral neck fracture The patient sustained a fall on 8/5 and as of the morning of 8/6, the patient was unable to walk due to new right leg weakness. His pain is not well controlled at this time and he states that he just feels "sore" in his right leg, specifically in his knee. Increased his dilaudid to '1mg'$  q4h yesterday and added Tylenol '1000mg'$  TID for his pain management.  - Orthopedics consulted, appreciate recommendations  - OR today for hemiarthroplasty vs total with ortho - Continue pain management with dilaudid '1mg'$  q4 hours PRN - PT/OT eval and treat - Likely will need SNF placement upon discharge vs home health at facility he resides at - Will resume VTE prophylaxis after surgery   Best Practice: Diet: Regular diet, NPO at midnight IVF: Fluids: none VTE: rivaroxaban '10mg'$  daily after surgery Code: Full Therapy Recs: SNF Contact: Legal Theora [id number] [phone] or [name] (caregiver) [phone] DISPO: Anticipated discharge to [place] facility pending clinical improvement and ortho recs  Signature: Buddy Duty, D.O.  Internal  Medicine Resident, PGY-1 [facility]  Pager: [phone] 6:04 AM, 08/01/2021   Please contact the on call pager after 5 pm and on weekends at [phone].

## 2021-08-01 NOTE — Op Note (Addendum)
TOTAL HIP ARTHROPLASTY ANTERIOR APPROACH  Procedure Note Marvin Mahoney   [id number]  Pre-op Diagnosis: Fractured Right femoral neck      Post-op Diagnosis: same   Operative Procedures  1. Prosthetic replacement for femoral neck fracture. CPT [phone] 2. Application of incisional VAC right hip.  CPT [id number]  Personnel  Surgeon(s): Leandrew Koyanagi, MD  ASSIST: Madalyn Rob, PA-C   Anesthesia: general  Prosthesis: Depuy Femur: Actis 6 HO Head: 49/28 mm size: +1.5 Bearing Type: bipolar  Hip Hemiarthroplasty (Anterior Approach) Op Note:  After informed consent was obtained and the operative extremity marked in the holding area, the patient was brought back to the operating room and placed supine on the HANA table. Next, the operative extremity was prepped and draped in normal sterile fashion. Surgical timeout occurred verifying patient identification, surgical site, surgical procedure and administration of antibiotics.  A modified anterior Smith-Peterson approach to the hip was performed, using the interval between tensor fascia lata and sartorius.  Dissection was carried bluntly down onto the anterior hip capsule. The lateral femoral circumflex vessels were identified and coagulated. A capsulotomy was performed and fracture hematoma was evacuated and the capsular flaps tagged for later repair.  The neck osteotomy was performed below the fracture. The femoral head was removed and found a 49 mm head was the appropriate fit.  His hip musculature was fairly tight given his underlying cerebral palsy.   We then turned our attention to the femur.  After placing the femoral hook, the leg was taken to externally rotated, extended and adducted position taking care to perform soft tissue releases to allow for adequate mobilization of the femur. Soft tissue was cleared from the shoulder of the greater trochanter and the hook elevator used to improve exposure of the proximal femur. Sequential broaching  performed up to a size 6. Trial neck and head were placed. The leg was brought back up to neutral and the construct reduced. The position and sizing of components, offset and leg lengths were checked using fluoroscopy. Stability of the construct was checked in extension and external rotation without any subluxation or impingement of prosthesis. We dislocated the prosthesis, dropped the leg back into position, removed trial components, and irrigated copiously. The final stem and head was then placed, the leg brought back up, the system reduced and fluoroscopy used to verify positioning.  We irrigated, obtained hemostasis and closed the capsule using #2 ethibond suture.  The fascia was closed with #1 vicryl plus, the deep fat layer was closed with 0 vicryl, the subcutaneous layers closed with 2.0 Vicryl Plus and the skin closed with 2.0 nylon and incisional VAC.  The patient was awakened in the operating room and taken to recovery in stable condition. All sponge, needle, and instrument counts were correct at the end of the case.   Tawanna Cooler, my PA, was necessary for opening, closing, exposing, retracting, limb positioning and overall facilitation and completion of the surgery.  Position: supine  Complications: see description of procedure.  Time Out: performed   Drains/Packing: none  Estimated blood loss: see anesthesia record  Returned to Recovery Room: in good condition.   Antibiotics: yes   Mechanical VTE (DVT) Prophylaxis: sequential compression devices, TED thigh-high  Chemical VTE (DVT) Prophylaxis: continue aspirin  Fluid Replacement: Crystalloid: see anesthesia record  Specimens Removed: 1 to pathology   Sponge and Instrument Count Correct? yes   PACU: portable radiograph - low AP   Admission: inpatient status, start PT & OT POD#1  Plan/RTC: Return in 2 weeks for staple removal. Return in 6 weeks to see MD.  Weight Bearing/Load Lower Extremity: full  Hip precautions:  none  N. Eduard Roux, MD [facility] 3:42 PM   Implant Name Type Inv. Item Serial No. Manufacturer Lot No. LRB No. Used Action  STEM FEMORAL SZ6 HIGH ACTIS - [id number] Stem STEM FEMORAL SZ6 HIGH ACTIS  [employer] [id number] Right 1 Implanted  BIPOLAR DEPUY 49MM - [id number] Hips BIPOLAR DEPUY 49MM  [employer]TH[employer] [id number] Right 1 Implanted  HIP BALL ARTICU DEPUY - [id number] Hips HIP BALL ARTICU DEPUY  [employer]TH[employer] [id number] Right 1 Implanted

## 2021-08-01 NOTE — H&P (Signed)

## 2021-08-02 ENCOUNTER — Inpatient Hospital Stay (HOSPITAL_COMMUNITY): Payer: Medicare Other

## 2021-08-02 ENCOUNTER — Encounter (HOSPITAL_COMMUNITY): Payer: Self-pay | Admitting: Orthopaedic Surgery

## 2021-08-02 LAB — BASIC METABOLIC PANEL
Anion gap: 8 (ref 5–15)
BUN: 15 mg/dL (ref 6–20)
CO2: 28 mmol/L (ref 22–32)
Calcium: 8.5 mg/dL — ABNORMAL LOW (ref 8.9–10.3)
Chloride: 102 mmol/L (ref 98–111)
Creatinine, Ser: 0.85 mg/dL (ref 0.61–1.24)
GFR, Estimated: >60 mL/min (ref >60)
Glucose, Bld: 129 mg/dL — ABNORMAL HIGH (ref 70–99)
Potassium: 4.8 mmol/L (ref 3.5–5.1)
Sodium: 138 mmol/L (ref 135–145)

## 2021-08-02 LAB — CBC
HCT: 37.9 % — ABNORMAL LOW (ref 39.0–52.0)
Hemoglobin: 12.4 g/dL — ABNORMAL LOW (ref 13.0–17.0)
MCH: 30.8 pg (ref 26.0–34.0)
MCHC: 32.7 g/dL (ref 30.0–36.0)
MCV: 94 fL (ref 80.0–100.0)
Platelets: 321 10*3/uL (ref 150–400)
RBC: 4.03 MIL/uL — ABNORMAL LOW (ref 4.22–5.81)
RDW: 12.3 % (ref 11.5–15.5)
WBC: 10.6 10*3/uL — ABNORMAL HIGH (ref 4.0–10.5)
nRBC: 0 % (ref 0.0–0.2)

## 2021-08-02 MED ORDER — HYDROCODONE-ACETAMINOPHEN 5-325 MG PO TABS: 1.0000 | ORAL_TABLET | Freq: Four times a day (QID) | ORAL | 0 refills | Status: DC | PRN

## 2021-08-02 MED ORDER — RISPERIDONE 2 MG PO TBDP
2.0000 mg | ORAL_TABLET | Freq: Every day | ORAL | Status: DC
  Administered 2021-08-02 – 2021-08-03 (×2): 2 mg via ORAL
  Filled 2021-08-02 (×3): qty 1

## 2021-08-02 NOTE — Progress Notes (Signed)
Occupational Therapy Co-Treatment  Focus of session on bed mobility, sit to stand transfers and lateral steps toward [facility] in prep for ADLs/IADLs. Patient c/o pain in R knee but participates with increased coaxing/encouragement. Patient stood from EOB without therapists prompting him to do so. Required assist to advance RLE with lateral steps toward HOB. Patient would benefit from use of hemi-walker to increase mobility as able at time of next treatment session.    08/02/21 1300  OT Visit Information  Last OT Received On 08/02/21  Assistance Needed +2  PT/OT/SLP Co-Evaluation/Treatment Yes  Reason for Co-Treatment Complexity of the patient's impairments (multi-system involvement)  OT goals addressed during session ADL's and self-care;Strengthening/ROM  History of Present Illness Pt is a 60 y.o. male who presented 07/28/21 s/p fall at his daycare facility and noted increased R-sided weakness. CT and MRI negative for acute intracranial processes. Imaging of R knee negative for acute osseous abnormalities. Chronic appearing RIGHT femoral neck fracture with superolateral displacement of at least 1.5 cm s/p prosthetic replacement of femoral neck with wound vac 8/11. PMHx of HTN, cerebral palsy, prior stroke with right sided deficits (unknown date, >5 years ago), HTN, depression, and developmental delay documented as "mental retardation".  Precautions  Precautions Fall  Pain Assessment  Pain Assessment Faces  Faces Pain Scale 8  Pain Location R knee  Pain Descriptors / Indicators Discomfort;Grimacing;Guarding;Moaning  Pain Intervention(s) Limited activity within patient's tolerance;Monitored during session;Repositioned;Patient requesting pain meds-RN notified;RN gave pain meds during session  Cognition  Arousal/Alertness Awake/alert  Behavior During Therapy Flat affect  Overall Cognitive Status History of cognitive impairments - at baseline  General Comments Developmentally delayed; poor R/L  discrimination  ADL  Overall ADL's  Needs assistance/impaired  Eating/Feeding Set up;Bed level  Grooming Set up;Bed level  Grooming Details (indicate cue type and reason) Washed face only. Declined oral hygiene.  Bed Mobility  Overal bed mobility Needs Assistance  Bed Mobility Supine to Sit;Sit to Supine  Supine to sit Min assist  Sit to supine Mod assist;+2 for physical assistance  General bed mobility comments cued and assist to manage legs off R EOB and elevate trunk to sit up . ModAx2 to manage legs and truck back onto bed due to pain  Balance  Overall balance assessment Needs assistance  Sitting-balance support Single extremity supported;No upper extremity supported;Feet supported  Sitting balance-Leahy Scale Poor  Standing balance support Single extremity supported  Standing balance-Leahy Scale Poor  Standing balance comment Stood for 3 min with unilateral UE support.  Restrictions  Weight Bearing Restrictions Yes  RLE Weight Bearing WBAT  Transfers  Overall transfer level Needs assistance  Equipment used 2 person hand held assist  Transfers Sit to/from Stand  Sit to Stand Min assist;+2 physical assistance  General transfer comment pt initiating stand prior to therapist prepared and provided only stabilizing assist (pt also stabilizing himself with posterior LLE against bedframe)  General Comments  General comments (skin integrity, edema, etc.) pt quickly becomes anxious with talk of moving his RLE "You're going to kill me!" Somewhat calmed by low tone and volume of voice; may do better with distraction and not discussing mobility  OT - End of Session  Equipment Utilized During Treatment Gait belt  Activity Tolerance Patient limited by pain  Patient left in bed;with call bell/phone within reach  Nurse Communication Mobility status;Patient requests pain meds  OT Assessment/Plan  OT Plan Discharge plan remains appropriate;Frequency remains appropriate  OT Visit Diagnosis  Unsteadiness on feet (R26.81);Other abnormalities of gait and  mobility (R26.89);Muscle weakness (generalized) (M62.81);Pain  Pain - Right/Left  (bilateral)  Pain - part of body Knee  OT Frequency (ACUTE ONLY) Min 2X/week  AM-PAC OT "6 Clicks" Daily Activity Outcome Measure (Version 2)  Help from another person eating meals? 3  Help from another person taking care of personal grooming? 3  Help from another person toileting, which includes using toliet, bedpan, or urinal? 2  Help from another person bathing (including washing, rinsing, drying)? 2  Help from another person to put on and taking off regular upper body clothing? 3  Help from another person to put on and taking off regular lower body clothing? 1  6 Click Score 14  Progressive Mobility  What is the highest level of mobility based on the progressive mobility assessment? Level 2 ([place]) - Balance while sitting on edge of bed and cannot stand  Mobility Sit up in bed/chair position for meals  OT Goal Progression  Progress towards OT goals Progressing toward goals  Acute Rehab OT Goals  Patient Stated Goal to eat and watch TV  OT Goal Formulation With patient  Time For Goal Achievement 08/12/21  Potential to Achieve Goals Good  ADL Goals  Pt Will Perform Grooming with modified independence;standing  Pt Will Perform Upper Body Bathing with set-up;sitting  Pt Will Perform Lower Body Bathing sit to/from stand;with set-up;with supervision  Pt Will Perform Upper Body Dressing with set-up;sitting  Pt Will Perform Lower Body Dressing with set-up;sit to/from stand  Pt Will Transfer to Toilet with modified independence;ambulating;regular height toilet;grab bars  Pt Will Perform Toileting - Clothing Manipulation and hygiene with modified independence;sit to/from stand  Additional ADL Goal #1 Pt will be Mod I in and OOB for basic ADLs  OT Time Calculation  OT Start Time (ACUTE ONLY) 1233  OT Stop Time (ACUTE ONLY) 1259  OT Time  Calculation (min) 26 min  OT General Charges  $OT Visit 1 Visit  OT Treatments  $Self Care/Home Management  8-22 mins   Cabot Cromartie H. OTR/L Supplemental OT, Department of rehab services [phone]

## 2021-08-02 NOTE — Progress Notes (Signed)
Physical Therapy Treatment Patient Details Name: Marvin Mahoney MRN: [id number] DOB: 05-Sep-1961 Today's Date: 08/02/2021    History of Present Illness Pt is a 60 y.o. male who presented 07/28/21 s/p fall at his daycare facility and noted increased R-sided weakness. CT and MRI negative for acute intracranial processes. Imaging of R knee negative for acute osseous abnormalities. Chronic appearing RIGHT femoral neck fracture with superolateral displacement of at least 1.5 cm s/p prosthetic anterior replacement of femoral neck with wound vac 8/11. PMHx of HTN, cerebral palsy, prior stroke with right sided deficits (unknown date, >5 years ago), HTN, depression, and developmental delay documented as "mental retardation".    PT Comments    Patient became very anxious with initial attempts to mobilize or even do RLE exercises. Able to somewhat reduce his anxiety and pt progressed to sit EOB, sit to stand, and even side-stepping toward [facility]. Ultimately participated in [place]. Will benefit from pre-medication and perhaps an activity to distract him from the functional tasks required.    Follow Up Recommendations  SNF;Supervision/Assistance - 24 hour     Equipment Recommendations  Rolling walker with 5" wheels;3in1 (PT)    Recommendations for Other Services       Precautions / Restrictions Precautions Precautions: Fall Restrictions RLE Weight Bearing: Weight bearing as tolerated    Mobility  Bed Mobility Overal bed mobility: Needs Assistance Bed Mobility: Supine to Sit;Sit to Supine     Supine to sit: Min assist Sit to supine: Mod assist;+2 for physical assistance   General bed mobility comments: cued and assist to manage legs off R EOB and elevate trunk to sit up . ModAx2 to manage legs and truck back onto bed due to pain    Transfers Overall transfer level: Needs assistance Equipment used: 2 person hand held assist Transfers: Sit to/from Stand Sit to Stand: Min assist;+2  safety/equipment         General transfer comment: pt initiating stand prior to therapist prepared and provided only stabilizing assist (pt also stabilizing himself with posterior LLE against bedframe)  Ambulation/Gait Ambulation/Gait assistance: Mod assist;+2 physical assistance Gait Distance (Feet): 1 Feet Assistive device: 2 person hand held assist Gait Pattern/deviations: Step-to pattern     General Gait Details: side step to his right toward HOB (~8 steps total); tactile cues to distinguish which leg he should advance as he does not do well with rt vs lt   Stairs             Wheelchair Mobility    Modified Rankin (Stroke Patients Only) Modified Rankin (Stroke Patients Only) Pre-Morbid Rankin Score: Severe disability Modified Rankin: Severe disability     Balance Overall balance assessment: Needs assistance Sitting-balance support: Single extremity supported;No upper extremity supported;Feet supported Sitting balance-Leahy Scale: Poor     Standing balance support: Bilateral upper extremity supported Standing balance-Leahy Scale: Poor Standing balance comment: stood ~3 minutes                            Cognition Arousal/Alertness: Awake/alert Behavior During Therapy: Flat affect Overall Cognitive Status: History of cognitive impairments - at baseline                                 General Comments: Pt with diagnosis of cerebral palsy and mental retardation at baseline. Pt with difficulty identifying L from R,      Exercises General  Exercises - Lower Extremity Ankle Circles/Pumps: PROM;Right;AROM;Left;10 reps Heel Slides: AAROM;Right;10 reps Hip ABduction/ADduction: AAROM;Right;10 reps Straight Leg Raises: AAROM;Right;5 reps    General Comments General comments (skin integrity, edema, etc.): pt quickly becomes anxious with talk of moving his RLE "You're going to kill me!" Somewhat calmed by low tone and volume of voice; may do  better with distraction and not discussing mobility      Pertinent Vitals/Pain Pain Assessment: Faces Faces Pain Scale: Hurts whole lot Pain Location: R knee Pain Descriptors / Indicators: Discomfort;Grimacing;Guarding;Moaning Pain Intervention(s): Limited activity within patient's tolerance;Monitored during session;Repositioned;Patient requesting pain meds-RN notified;RN gave pain meds during session    Home Living Family/patient expects to be discharged to:: Skilled nursing facility               Additional Comments: [place] with 18-20 stairs with bil rails to get to his bedroom upstairs. Tub/shower combo with seat available. Goes to daycare during day.    Prior Function Level of Independence: Needs assistance  Gait / Transfers Assistance Needed: Caregiver at group home reports pt is capable of ambulating and negotiating stairs using the rail independently without AD/AE. ADL's / Homemaking Assistance Needed: Has tub/shower combo and has a seat, but does not use seat. Needs assistance with set-up for bathing and dressing only. Capable of toileting himself. Chuck manages pt's meds and meals. Comments: Does not have w/c.   PT Goals (current goals can now be found in the care plan section) Acute Rehab PT Goals Patient Stated Goal: to eat Time For Goal Achievement: 08/12/21 Potential to Achieve Goals: Good Progress towards PT goals: Progressing toward goals    Frequency    Min 2X/week      PT Plan      Co-evaluation PT/OT/SLP Co-Evaluation/Treatment: Yes Reason for Co-Treatment: Complexity of the patient's impairments (multi-system involvement);To address functional/ADL transfers PT goals addressed during session: Mobility/safety with mobility;Balance        AM-PAC PT "6 Clicks" Mobility   Outcome Measure  Help needed turning from your back to your side while in a flat bed without using bedrails?: Total Help needed moving from lying on your back to sitting  on the side of a flat bed without using bedrails?: Total Help needed moving to and from a bed to a chair (including a wheelchair)?: Total Help needed standing up from a chair using your arms (e.g., wheelchair or bedside chair)?: Total Help needed to walk in hospital room?: Total Help needed climbing 3-5 steps with a railing? : Total 6 Click Score: 6    End of Session Equipment Utilized During Treatment: Gait belt Activity Tolerance: Patient limited by pain Patient left: in bed;with call bell/phone within reach Nurse Communication: Mobility status;Need for lift equipment PT Visit Diagnosis: Unsteadiness on feet (R26.81);Muscle weakness (generalized) (M62.81);History of falling (Z91.81);Difficulty in walking, not elsewhere classified (R26.2);Other symptoms and signs involving the nervous system (R29.898);Pain Pain - Right/Left: Right Pain - part of body: Knee     Time: 1231-1259 PT Time Calculation (min) (ACUTE ONLY): 28 min  Charges:  $Therapeutic Activity: 8-22 mins                      Arby Barrette, PT Pager [phone]    Rexanne Mano 08/02/2021, 1:30 PM

## 2021-08-02 NOTE — Care Management Important Message (Signed)
Important Message  Patient Details  Name: Marvin Mahoney MRN: [id number] Date of Birth: [birth year]   Medicare Important Message Given:  Yes     [name] Montine Circle 08/02/2021, 2:44 PM

## 2021-08-02 NOTE — TOC Progression Note (Signed)
Transition of Care [facility]) - Progression Note    Patient Details  Name: Marvin Mahoney MRN: [id number] Date of Birth: 05-18-1961  Transition of Care [facility]) CM/SW [place], RN Phone [phone]  08/02/2021, 2:01 PM  Clinical Narrative:    CM received message from MD stating that patient is approaching being ready for d/c with wound vac. CM has faxed out info for bed offers. CM will reach out to [facility] for details on wound vac.    Expected Discharge Plan: [place] Barriers to Discharge: Continued Medical Work up, SNF Pending bed offer  Expected Discharge Plan and Services Expected Discharge Plan: [place]: [place] arrangements for the past 2 months: Group Home                                       Social Determinants of Health (SDOH) Interventions    Readmission Risk Interventions No flowsheet data found.

## 2021-08-02 NOTE — Progress Notes (Signed)
Subjective: 1 Day Post-Op Procedure(s) (LRB): RIGHT HIP HEMI  ARTHROPLASTY ANTERIOR APPROACH (Right) Patient reports pain as mild.    Objective: Vital signs in last 24 hours: Temp:  [97.8 F (36.6 C)-98.4 F (36.9 C)] 98.4 F (36.9 C) (08/11 0502) Pulse Rate:  [65-99] 91 (08/11 0502) Resp:  [13-20] 16 (08/11 0502) BP: (113-154)/(78-95) 132/78 (08/11 0502) SpO2:  [92 %-97 %] 94 % (08/11 0502) Weight:  [82.9 kg] 82.9 kg (08/11 0500)  Intake/Output from previous day: 08/10 0701 - 08/11 0700 In: 1200 [I.V.:1200] Out: 1600 [Urine:1350; Blood:250] Intake/Output this shift: No intake/output data recorded.  Recent Labs    08/01/21 0352 08/02/21 0338  HGB 12.8* 12.4*   Recent Labs    08/01/21 0352 08/02/21 0338  WBC 8.1 10.6*  RBC 4.08* 4.03*  HCT 38.0* 37.9*  PLT 252 321   Recent Labs    08/01/21 0352 08/02/21 0338  NA 138 138  K 3.8 4.8  CL 102 102  CO2 30 28  BUN 18 15  CREATININE 0.87 0.85  GLUCOSE 114* 129*  CALCIUM 8.5* 8.5*   No results for input(s): LABPT, INR in the last 72 hours.  Neurologically intact Neurovascular intact Sensation intact distally Intact pulses distally Dorsiflexion/Plantar flexion intact Incision: dressing C/D/I No cellulitis present Compartment soft Wound vac in place and properly functioning.  No fluid in canister   Assessment/Plan: 1 Day Post-Op Procedure(s) (LRB): RIGHT HIP HEMI  ARTHROPLASTY ANTERIOR APPROACH (Right) Up with therapy WBAT RLE Continue wound vac.  Please switch home unit to portable unit at discharge Continue xarelto Follow up with Dr. Erlinda Hong one week after discharge for wound vac removal       Aundra Dubin 08/02/2021, 8:01 AM

## 2021-08-02 NOTE — Progress Notes (Signed)
   HD#5 SUBJECTIVE:  Patient Summary: Marvin Mahoney is a 60 y.o. with a pertinent PMH of HTN, cerebral palsy, prior stroke with residual right sided deficits, depression, and cognitive impairment who presented with new onset right sided weakness and admitted for right femoral neck fracture  Overnight Events: No acute events overnight  Interim History: This is hospital day 5 for Marvin Mahoney who was seen and evaluated at the bedside this morning. He is post op day 1 following a right hip hemiarthroplasty with wound vac placement. The patient is no longer having pain in his right leg and remains stable at this time.  OBJECTIVE:  Vital Signs: Vitals:   08/01/21 1729 08/01/21 1936 08/02/21 0500 08/02/21 0502  BP: (!) 135/95 113/82  132/78  Pulse: 90 94  91  Resp: '13 14  16  '$ Temp: 98.1 F (36.7 C) 98.1 F (36.7 C)  98.4 F (36.9 C)  TempSrc: Oral Oral  Oral  SpO2: 97% 92%  94%  Weight:   82.9 kg   Height:       Supplemental O2: Nasal Cannula SpO2: 94 % O2 Flow Rate (L/min): 2 L/min FiO2 (%): 100 %  Filed Weights   07/28/21 1603 08/02/21 0500  Weight: 78.5 kg 82.9 kg     Intake/Output Summary (Last 24 hours) at 08/02/2021 0643 Last data filed at 08/02/2021 0503 Gross per 24 hour  Intake 1200 ml  Output 1600 ml  Net -400 ml   Net IO Since Admission: -1,600 mL [08/02/21 0643]  Physical Exam: General: Chronically ill-appearing male laying in bed. No acute distress, but appears to be in pain. CV: RRR. No murmurs, rubs, or gallops. No LE edema Pulmonary: Lungs CTAB. Normal effort. No wheezing or rales. Extremities: Palpable radial and DP pulses. R lower extremity is atrophied compared to left, although both limbs are equal in length now. Right arm contracture noted. Wound vac placed on R hip. Skin: Warm and dry. No obvious rash or lesions. Neuro: A&O to self and place. Normal sensation. Psych: Appropriate mood and affect   ASSESSMENT/PLAN:  Assessment: Principal Problem:    Fracture of femoral neck, right (HCC) Active Problems:   CP (cerebral palsy) (HCC)   Dementia (HCC)   Moderate protein-calorie malnutrition (HCC)   Intertrochanteric fracture of right hip (HCC)   Femoral neck fracture (HCC)   Plan: #Right femoral neck fracture The patient is post op day 1 following a right hip hemiarthroplasty with wound vac placement. He reports no further pain in his right leg at this time. Ortho has advised the patient to follow up in 2 weeks for staple removal and in 6 weeks for a routine ortho follow up. He will need a portable wound vac unit prior to discharge.  - Continue pain management with dilaudid '1mg'$  q4 hours PRN - PT/OT eval and treat - SNF placement upon discharge  - Resumed xarelto for VTE prophylaxis after the surgery on 8/10   Best Practice: Diet: Regular diet IVF: none VTE: rivaroxaban '10mg'$  daily after surgery Code: Full Therapy Recs: SNF Contact: Legal Theora [id number] [phone] or [name] (caregiver) [phone] DISPO: Anticipated discharge to [place] facility pending clinical improvement and ortho recs  Signature: Buddy Duty, D.O.  Internal Medicine Resident, PGY-1 [facility]  Pager: [phone] 6:43 AM, 08/02/2021   Please contact the on call pager after 5 pm and on weekends at [phone].

## 2021-08-03 LAB — VITAMIN D 25 HYDROXY (VIT D DEFICIENCY, FRACTURES): Vit D, 25-Hydroxy: 10.77 ng/mL — ABNORMAL LOW (ref 30–100)

## 2021-08-03 LAB — CBC
HCT: 35.2 % — ABNORMAL LOW (ref 39.0–52.0)
Hemoglobin: 11.5 g/dL — ABNORMAL LOW (ref 13.0–17.0)
MCH: 30.6 pg (ref 26.0–34.0)
MCHC: 32.7 g/dL (ref 30.0–36.0)
MCV: 93.6 fL (ref 80.0–100.0)
Platelets: 311 10*3/uL (ref 150–400)
RBC: 3.76 MIL/uL — ABNORMAL LOW (ref 4.22–5.81)
RDW: 12.2 % (ref 11.5–15.5)
WBC: 9.7 10*3/uL (ref 4.0–10.5)
nRBC: 0 % (ref 0.0–0.2)

## 2021-08-03 LAB — RESP PANEL BY RT-PCR (FLU A&B, COVID) ARPGX2
Influenza A by PCR: NEGATIVE
Influenza B by PCR: NEGATIVE
SARS Coronavirus 2 by RT PCR: NEGATIVE

## 2021-08-03 MED ORDER — RIVAROXABAN 10 MG PO TABS: 10.0000 mg | ORAL_TABLET | Freq: Every day | ORAL | 0 refills | Status: AC

## 2021-08-03 MED ORDER — ALENDRONATE SODIUM 70 MG PO TABS: 70.0000 mg | ORAL_TABLET | ORAL | 0 refills | Status: AC

## 2021-08-03 MED ORDER — VITAMIN D (ERGOCALCIFEROL) 1.25 MG (50000 UNIT) PO CAPS: 50000.0000 [IU] | ORAL_CAPSULE | ORAL | 0 refills | Status: AC

## 2021-08-03 MED ORDER — COVID-19 MRNA VACC (MODERNA) 50 MCG/0.25ML IM SUSP
0.2500 mL | Freq: Once | INTRAMUSCULAR | Status: AC
  Administered 2021-08-03: 0.25 mL via INTRAMUSCULAR
  Filled 2021-08-03: qty 0.25

## 2021-08-03 MED ORDER — HYDROCODONE-ACETAMINOPHEN 5-325 MG PO TABS: 1.0000 | ORAL_TABLET | Freq: Four times a day (QID) | ORAL | 0 refills | Status: AC | PRN

## 2021-08-03 MED ORDER — RISPERIDONE 2 MG PO TBDP: 2.0000 mg | ORAL_TABLET | Freq: Every day | ORAL | 0 refills | Status: AC

## 2021-08-03 MED ORDER — ALENDRONATE SODIUM 70 MG PO TABS: 70.0000 mg | ORAL_TABLET | ORAL | 0 refills | Status: DC

## 2021-08-03 NOTE — Discharge Summary (Addendum)
Name: Marvin Mahoney MRN: [id number] DOB: [birth year] 60 y.o. PCP: Pcp, No  Date of Admission: 07/28/2021 12:16 PM Date of Discharge:  08/03/2021 Attending Physician: Dr. Evette Doffing  DISCHARGE DIAGNOSIS:  Primary Problem: Fracture of femoral neck, right [facility])   Hospital Problems: Principal Problem:   Fracture of femoral neck, right ([place]) Active Problems:   CP (cerebral palsy) (HCC)   Dementia (HCC)   Moderate protein-calorie malnutrition ([place])   Intertrochanteric fracture of right hip (HCC)   Femoral neck fracture ([place])    DISCHARGE MEDICATIONS:   Allergies as of 08/03/2021   No Known Allergies      Medication List     TAKE these medications    alendronate 70 MG tablet Commonly known as: Fosamax Take 1 tablet (70 mg total) by mouth every 7 (seven) days. Take after vitamin D level has corrected. Take with a full glass of water on an empty stomach.   HYDROcodone-acetaminophen 5-325 MG tablet Commonly known as: NORCO/VICODIN Take 1 tablet by mouth every 6 (six) hours as needed for up to 7 days for moderate pain.   PARoxetine 20 MG tablet Commonly known as: PAXIL Take 60 mg by mouth at bedtime.   risperiDONE 2 MG disintegrating tablet Commonly known as: RISPERDAL M-TABS Take 1 tablet (2 mg total) by mouth at bedtime. What changed: Another medication with the same name was removed. Continue taking this medication, and follow the directions you see here.   rivaroxaban 10 MG Tabs tablet Commonly known as: XARELTO Take 1 tablet (10 mg total) by mouth daily for 28 days. Start taking on: August 04, 2021   tamsulosin 0.4 MG Caps capsule Commonly known as: FLOMAX Take 0.4 mg by mouth in the morning.   Vitamin D (Ergocalciferol) 1.25 MG (50000 UNIT) Caps capsule Commonly known as: DRISDOL Take 1 capsule (50,000 Units total) by mouth every 7 (seven) days.               Durable Medical Equipment  (From admission, onward)           Start     Ordered   08/03/21  1252  DME 3-in-1  Once        08/03/21 1254   08/03/21 1252  DME Walker  Once       Question Answer Comment  Walker: With 5 Inch Wheels   Patient needs a walker to treat with the following condition Closed right hip fracture ([place])      08/03/21 1254              Discharge Care Instructions  (From admission, onward)           Start     Ordered   08/03/21 0000  Leave dressing on - Keep it clean, dry, and intact until clinic visit        08/03/21 1254            DISPOSITION AND FOLLOW-UP:  MarvinMarvin Mahoney was discharged from [facility] in Stable condition. At the hospital follow up visit please address:  Follow-up Recommendations: Consults: Orthopedic referral Labs: Vitamin D level Medications: Norco every 6 hrs as needed for pain x 7 days, xarelto '10mg'$  x 30 days, vitamin D once weekly Please start alendronate therapy after vitamin D level has corrected  Follow-up Appointments:  Contact information for follow-up providers     Leandrew Koyanagi, MD. Schedule an appointment as soon as possible for a visit on 08/15/2021.   Specialty: Orthopedic Surgery Why:  staple removal Contact information: [place] [address] [phone]              Contact information for after-discharge care     Destination     [facility] CARE Preferred SNF .   Service: Skilled Nursing Contact information: [address] [place] COURSE:  Patient Summary: Marvin Mahoney is a 60 y.o. with a pertinent PMH of HTN, cerebral palsy, prior stroke with residual right sided deficits, depression, and cognitive impairment who presented with new onset right sided weakness and admitted for weakness and right femoral neck fracture.  #Right femoral neck fracture #Osteoporosis  Patient has a history of a prior stroke >6 years ago, with right sided residual deficits. The  patient sustained a fall on 8/5 and as of the morning of 8/6, the patient is unable to walk due to new right leg weakness and possibly had a facial droop noted by caregiver. CT head showed stable changes of the left ACA/MCA territory infarct and no evidence of acute intracranial hemorrhage/infarct. MRI brain negative for new infarct as well. No facial droop noted on exam, however, patient has very limited range of motion of right lower extremity. Patient has a lot of pain upon internal rotation of the right hip and the right leg is also noted to be shorter and more atrophied when compared to the left. R knee xray performed with no acute osseous abnormality noted, however, R hip xray performed and showed a displaced and slightly impacted basicervical fracture of the right femoral neck that is subacute to chronic appearing. Orthopedics consulted on 8/7 and advised that the patient have a hemiarthroplasty. Pain management dilaudid 0.'5mg'$  q4 hours PRN, although not requiring frequent doses. Patient was complaining of more pain on 8/9, specifically in his right knee. MRI of the knee was unable to be performed due to the amount of pain that the patient was in. Dilaudid increased to '1mg'$  q4 hours PRN. Patient went to the OR with ortho for right hip hemiarthropalsty on 8/10. Also had wound vac placed and is advised to have portable wound vac on discharge. On post op day 1, the patient remained stable and his pain was significantly improved. Remained pain free on post op day 2 and was medically stable for discharge. Will be discharged with alendronate as he sustained a fragility fracture, which is diagnostic of osteoporosis.     DISCHARGE INSTRUCTIONS:   Discharge Instructions     Call MD for:  extreme fatigue   Complete by: As directed    Call MD for:  persistant dizziness or light-headedness   Complete by: As directed    Call MD for:  persistant nausea and vomiting   Complete by: As directed    Call MD for:   redness, tenderness, or signs of infection (pain, swelling, redness, odor or green/yellow discharge around incision site)   Complete by: As directed    Call MD for:  severe uncontrolled pain   Complete by: As directed    Call MD for:  temperature >100.4   Complete by: As directed    Diet - low sodium heart healthy   Complete by: As directed    Increase activity slowly   Complete by: As directed    Leave dressing on - Keep it clean, dry, and intact until clinic visit   Complete by:  As directed        SUBJECTIVE:  This is hospital day 6 for Marvin Mahoney who was seen and evaluated at the bedside this morning. His is post op day 2 after a right hip hemiarthroplasty with wound vac placement. He continues to deny any pain and feels well overall. Encourage the patient to work with PT/OT. Discharge Vitals:   BP 120/62 (BP Location: Left Arm)   Pulse 82   Temp 98.3 F (36.8 C) (Oral)   Resp 20   Ht 6' (1.829 m)   Wt 84 kg   SpO2 95%   BMI 25.12 kg/m   OBJECTIVE:  General: Chronically ill-appearing male laying in bed. No acute distress. CV: RRR. No murmurs, rubs, or gallops. No LE edema Pulmonary: Lungs CTAB. Normal effort. No wheezing or rales. Extremities: Palpable radial and DP pulses. R lower extremity is atrophied compared to left, although both limbs are equal in length now. Right arm contracture noted. Wound vac placed on R hip with no output noted Skin: Warm and dry. No obvious rash or lesions. Neuro: A&O to self and place. Normal sensation. Psych: Appropriate mood and affect  Pertinent Labs, Studies, and Procedures:  CBC Latest Ref Rng & Units 08/03/2021 08/02/2021 08/01/2021  WBC 4.0 - 10.5 K/uL 9.7 10.6(H) 8.1  Hemoglobin 13.0 - 17.0 g/dL 11.5(L) 12.4(L) 12.8(L)  Hematocrit 39.0 - 52.0 % 35.2(L) 37.9(L) 38.0(L)  Platelets 150 - 400 K/uL 311 321 252    CMP Latest Ref Rng & Units 08/02/2021 08/01/2021 07/29/2021  Glucose 70 - 99 mg/dL 129(H) 114(H) 115(H)  BUN 6 - 20 mg/dL '15 18  11  '$ Creatinine 0.61 - 1.24 mg/dL 0.85 0.87 0.77  Sodium 135 - 145 mmol/L 138 138 136  Potassium 3.5 - 5.1 mmol/L 4.8 3.8 3.7  Chloride 98 - 111 mmol/L 102 102 103  CO2 22 - 32 mmol/L '28 30 26  '$ Calcium 8.9 - 10.3 mg/dL 8.5(L) 8.5(L) 8.4(L)  Total Protein 6.5 - 8.1 g/dL - - -  Total Bilirubin 0.3 - 1.2 mg/dL - - -  Alkaline Phos 38 - 126 U/L - - -  AST 15 - 41 U/L - - -  ALT 0 - 44 U/L - - -     DG HIP UNILAT WITH PELVIS 2-3 VIEWS RIGHT   IMPRESSION: There is a displaced and slightly impacted basicervical fracture of the right femoral neck, which is unchanged compared to prior examination and subacute to chronic appearing. No other fracture.    FOLLOW-UP INSTRUCTIONS:  Thank you for allowing [place] to be part of your care. You were hospitalized for Fracture of femoral neck, right ([place]).  Please follow up with the following providers: A. Primary care physician  B. Dr. Erlinda Hong, orthopedic surgery, for 2 week follow up and staple removal  Please note these changes made to your medications:   A. Medications to continue: Current Meds  Medication Sig   PARoxetine (PAXIL) 20 MG tablet Take 60 mg by mouth at bedtime.    risperiDONE (RISPERDAL) 2 MG tablet Take 2 mg by mouth at bedtime.   tamsulosin (FLOMAX) 0.4 MG CAPS capsule Take 0.4 mg by mouth in the morning.   Vitamin D, Ergocalciferol, (DRISDOL) 1.25 MG (50000 UNIT) CAPS capsule Take 1 capsule (50,000 Units total) by mouth every 7 (seven) days.   alendronate (FOSAMAX) 70 MG tablet Take 1 tablet (70 mg total) by mouth every 7 (seven) days. Take with a full glass of water on an empty stomach. AFTER  Vitamin D level has corrected      B. Medications to start:  NEW MEDS:   HYDROcodone-acetaminophen (NORCO/VICODIN) 5-325 MG per tablet 1 tablet, 1 tablet, Oral, Q6H PRN   rivaroxaban (XARELTO) tablet 10 mg daily   Vitamin D 50000 units weekly   Alendronate '70mg'$  tablet once weekly AFTER vitamin D level has corrected    Please make sure to  follow up with your primary care doctor to have your vitamin D levels rechecked and to have your staples removed in 2 weeks by Dr. Erlinda Hong  Please call our clinic if you have any questions or concerns, we may be able to help and keep you from a long and expensive emergency room wait. Our clinic and after hours phone number is [phone], the best time to call is Monday through Friday 9 am to 4 pm but there is always someone available 24/7 if you have an emergency. If you need medication refills please notify your pharmacy one week in advance and they will send [place] a request.       Signed: Buddy Duty, D.O.  Internal Medicine Resident, PGY-1 [facility]  Pager: [phone] 1:34 PM, 08/03/2021

## 2021-08-03 NOTE — Progress Notes (Signed)
HD#6 SUBJECTIVE:  Patient Summary: Marvin Mahoney is a 60 y.o. with a pertinent PMH of HTN, cerebral palsy, prior stroke with residual right sided deficits, depression, and cognitive impairment who presented with new onset right sided weakness and admitted for right femoral neck fracture  Overnight Events: No acute events overnight  Interim History: This is hospital day 6 for Marvin Mahoney who was seen and evaluated at the bedside this morning. His is post op day 2 after a right hip hemiarthroplasty with wound vac placement. He continues to deny any pain and feels well overall. Encourage the patient to work with PT/OT.   OBJECTIVE:  Vital Signs: Vitals:   08/02/21 1431 08/02/21 2202 08/03/21 0100 08/03/21 0500  BP: 118/70 117/74 120/62   Pulse: 83 82    Resp: '18 19 20   '$ Temp: 98.6 F (37 C) 98 F (36.7 C) 98.3 F (36.8 C)   TempSrc: Oral Oral Oral   SpO2: 94% 97% 95%   Weight:    84 kg  Height:       Supplemental O2: Nasal Cannula SpO2: 95 % O2 Flow Rate (L/min): 2 L/min FiO2 (%): 100 %  Filed Weights   07/28/21 1603 08/02/21 0500 08/03/21 0500  Weight: 78.5 kg 82.9 kg 84 kg     Intake/Output Summary (Last 24 hours) at 08/03/2021 1157 Last data filed at 08/03/2021 1028 Gross per 24 hour  Intake 220 ml  Output 450 ml  Net -230 ml   Net IO Since Admission: -1,830 mL [08/03/21 1157]  Physical Exam: General: Chronically ill-appearing male laying in bed. No acute distress. CV: RRR. No murmurs, rubs, or gallops. No LE edema Pulmonary: Lungs CTAB. Normal effort. No wheezing or rales. Extremities: Palpable radial and DP pulses. R lower extremity is atrophied compared to left, although both limbs are equal in length now. Right arm contracture noted. Wound vac placed on R hip with no output noted Skin: Warm and dry. No obvious rash or lesions. Neuro: A&O to self and place. Normal sensation. Psych: Appropriate mood and affect      ASSESSMENT/PLAN:  Assessment: Principal  Problem:   Fracture of femoral neck, right (HCC) Active Problems:   CP (cerebral palsy) (HCC)   Dementia (HCC)   Moderate protein-calorie malnutrition (HCC)   Intertrochanteric fracture of right hip (HCC)   Femoral neck fracture (HCC)   Plan: #Right femoral neck fracture #Osteoporosis  The patient is post op day 2 following a right hip hemiarthroplasty with wound vac placement. He reports no further pain in his right leg at this time. Ortho has advised the patient to follow up in 2 weeks for staple removal and in 6 weeks for a routine ortho follow up. He will need a portable wound vac unit prior to discharge. Patient will also need a COVID booster and COVID test prior to SNF placement. A bed will be available for him on Monday. Will likely discharge the patient on alendronate as he sustained a fragility fracture, which is consistent with a diagnosis of osteoporosis.  - Continue pain management with dilaudid '1mg'$  q4 hours PRN - Repeat CBC - Checking vitamin D level - PT/OT eval and treat - SNF placement upon discharge    Best Practice: Diet: Regular diet IVF: none VTE: rivaroxaban '10mg'$  daily  Code: Full Therapy Recs: SNF Contact: Legal Theora [id number] [phone] or [name] (caregiver) [phone] DISPO: Anticipated discharge to [facility] facility on Monday, 8/15  Signature: Buddy Duty, D.O.  Internal Medicine Resident, PGY-1 [facility]  Pager: [phone] 11:57 AM, 08/03/2021   Please contact the on call pager after 5 pm and on weekends at [phone].

## 2021-08-03 NOTE — TOC Transition Note (Signed)
Transition of Care [facility]) - CM/SW Discharge Note   Patient Details  Name: Marvin Mahoney MRN: [id number] Date of Birth: [birth year]  Transition of Care [facility]) CM/SW Contact:  Coralee Pesa, [place] Phone Number: 08/03/2021, 3:31 PM   Clinical Narrative:    Pt to be transported to [facility] via [facility]. Nurse to call report to [phone]. Covid needs to result before pt can transport.     Barriers to Discharge: Continued Medical Work up, SNF Pending bed offer   Patient Goals and CMS Choice Patient states their goals for this hospitalization and ongoing recovery are:: Pt unable to participate in goal setting. CMS Medicare.gov Compare Post Acute Care list provided to:: Legal Guardian Choice offered to / list presented to : [facility] POA / [place]  Discharge Placement                       Discharge Plan and Services     Post Acute Care Choice: [place]                               Social Determinants of Health (SDOH) Interventions     Readmission Risk Interventions No flowsheet data found.

## 2021-08-03 NOTE — Discharge Instructions (Addendum)
FOLLOW-UP INSTRUCTIONS:  Thank you for allowing Marvin Mahoney to be part of your care. You were hospitalized for Fracture of femoral neck, right ([place]).  Please follow up with the following providers: A. Primary care physician  B. Dr. Erlinda Hong, orthopedic surgery, for 2 week follow up and staple removal  Please note these changes made to your medications:   A. Medications to continue: Current Meds  Medication Sig   PARoxetine (PAXIL) 20 MG tablet Take 60 mg by mouth at bedtime.    risperiDONE (RISPERDAL) 2 MG tablet Take 2 mg by mouth at bedtime.   tamsulosin (FLOMAX) 0.4 MG CAPS capsule Take 0.4 mg by mouth in the morning.   Vitamin D, Ergocalciferol, (DRISDOL) 1.25 MG (50000 UNIT) CAPS capsule Take 1 capsule (50,000 Units total) by mouth every 7 (seven) days.   alendronate (FOSAMAX) 70 MG tablet Take 1 tablet (70 mg total) by mouth every 7 (seven) days. Take with a full glass of water on an empty stomach. AFTER Vitamin D level has corrected      B. Medications to start:  NEW MEDS:   HYDROcodone-acetaminophen (NORCO/VICODIN) 5-325 MG per tablet 1 tablet, 1 tablet, Oral, Q6H PRN   rivaroxaban (XARELTO) tablet 10 mg daily   Vitamin D 50000 units weekly   Alendronate '70mg'$  tablet once weekly AFTER vitamin D level has corrected    Please make sure to follow up with your primary care doctor to have your vitamin D levels rechecked and to have your staples removed in 2 weeks by Dr. Erlinda Hong  Please call our clinic if you have any questions or concerns, we may be able to help and keep you from a long and expensive emergency room wait. Our clinic and after hours phone number is [phone], the best time to call is Monday through Friday 9 am to 4 pm but there is always someone available 24/7 if you have an emergency. If you need medication refills please notify your pharmacy one week in advance and they will send Marvin Mahoney a request  Information on my medicine - XARELTO (Rivaroxaban)  This medication education was  reviewed with me or my healthcare representative as part of my discharge preparation.   Why was Xarelto prescribed for you? Xarelto was prescribed for you to reduce the risk of blood clots forming after orthopedic surgery. The medical term for these abnormal blood clots is venous thromboembolism (VTE).  What do you need to know about xarelto ? Take your Xarelto ONCE DAILY at the same time every day. You may take it either with or without food.  If you have difficulty swallowing the tablet whole, you may crush it and mix in applesauce just prior to taking your dose.  Take Xarelto exactly as prescribed by your doctor and DO NOT stop taking Xarelto without talking to the doctor who prescribed the medication.  Stopping without other VTE prevention medication to take the place of Xarelto may increase your risk of developing a clot.  After discharge, you should have regular check-up appointments with your healthcare provider that is prescribing your Xarelto.    What do you do if you miss a dose? If you miss a dose, take it as soon as you remember on the same day then continue your regularly scheduled once daily regimen the next day. Do not take two doses of Xarelto on the same day.   Important Safety Information A possible side effect of Xarelto is bleeding. You should call your healthcare provider right away if you experience  any of the following: Bleeding from an injury or your nose that does not stop. Unusual colored urine (red or dark brown) or unusual colored stools (red or black). Unusual bruising for unknown reasons. A serious fall or if you hit your head (even if there is no bleeding).  Some medicines may interact with Xarelto and might increase your risk of bleeding while on Xarelto. To help avoid this, consult your healthcare provider or pharmacist prior to using any new prescription or non-prescription medications, including herbals, vitamins, non-steroidal anti-inflammatory  drugs (NSAIDs) and supplements.  This website has more information on Xarelto: [email or web address]

## 2021-08-03 NOTE — TOC Progression Note (Signed)
Transition of Care [facility]) - Progression Note    Patient Details  Name: Marvin Mahoney MRN: [id number] Date of Birth: 1961-12-23  Transition of Care [facility]) CM/SW [place], [place] Phone Number: 08/03/2021, 12:12 PM  Clinical Narrative:    CSW spoke with DSS legal guardian who noted he would like for pt to go to [employer]. DO stated a wound vac was in the room. Facility requesting pt get a booster, LG consented, but it is ultimately up to the pt. Facility stated they will not have an open bed until Monday, but will have one at their [place] location. They then came back and said they will have one at [facility]. CSW requested covid test and pt will get booster. Plan to DC this afternoon.   Expected Discharge Plan: [place] Barriers to Discharge: Continued Medical Work up, SNF Pending bed offer  Expected Discharge Plan and Services Expected Discharge Plan: [place] Choice: [place] arrangements for the past 2 months: Group Home                                       Social Determinants of Health (SDOH) Interventions    Readmission Risk Interventions No flowsheet data found.

## 2021-08-04 NOTE — Progress Notes (Signed)
Attempted to call report to facility receiving patient - [phone].

## 2021-08-04 NOTE — Progress Notes (Signed)
PTAR arrived during day shift to transfer patient to [facility] but COVID result was still pending . PTAR had to cancel transfer and to call them back once result was negative. PTAR was not called back until 2000. [place] in attempt to give report for patient to be transferred to facility. Three phone call attempts were made but no answer and no voicemail prompt to leave a message. Called PTAR back to see if transfer can be rescheduled again. Patient placed on call-back status and PTAR needs to be notified when [facility] is ready for this patient. Report still needs to be made to the nurse at [facility]. Please see Social Worker note for phone number.

## 2021-08-14 ENCOUNTER — Ambulatory Visit (INDEPENDENT_AMBULATORY_CARE_PROVIDER_SITE_OTHER): Payer: Medicare Other | Admitting: Orthopaedic Surgery

## 2021-08-14 ENCOUNTER — Other Ambulatory Visit: Payer: Self-pay

## 2021-08-14 ENCOUNTER — Ambulatory Visit (INDEPENDENT_AMBULATORY_CARE_PROVIDER_SITE_OTHER): Payer: Medicare Other

## 2021-08-14 DIAGNOSIS — M25551 Pain in right hip: Secondary | ICD-10-CM

## 2021-08-14 DIAGNOSIS — Z96641 Presence of right artificial hip joint: Secondary | ICD-10-CM

## 2021-08-14 NOTE — Progress Notes (Signed)
   Post-Op Visit Note   Patient: Marvin Mahoney           Date of Birth: February 21, 1961           MRN: [id number] Visit Date: 08/14/2021 PCP: Pcp, No   Assessment & Plan:  Chief Complaint:  Chief Complaint  Patient presents with   Right Hip - Routine Post Op, Wound Check, Pain   Visit Diagnoses:  1. Pain in right hip   2. History of right hip hemiarthroplasty     Plan: Patient is a pleasant 60 year old gentleman with an underlying history of CP who comes in today 2 weeks status post right hip hemiarthroplasty from a femoral neck fracture, date of surgery 08/01/2021.  He has been living at [facility].  He has been getting physical therapy.  Of note, he ambulates in a wheelchair which is his baseline.  Examination of the right hip reveals a fully healed surgical incision without complication.  No evidence of infection or cellulitis.  Today, wound VAC and sutures were removed.  Steri-Strips applied.  He will continue with physical therapy.  He will follow-up with [place] in 4 weeks time for repeat evaluation and AP pelvis x-rays.  Call with concerns or questions in the meantime.  Follow-Up Instructions: Return in about 4 weeks (around 09/11/2021).   Orders:  Orders Placed This Encounter  Procedures   XR HIP UNILAT W OR W/O PELVIS 2-3 VIEWS RIGHT   No orders of the defined types were placed in this encounter.   Imaging: XR HIP UNILAT W OR W/O PELVIS 2-3 VIEWS RIGHT  Result Date: 08/14/2021 Well-seated prosthesis without complication   PMFS History: Patient Active Problem List   Diagnosis Date Noted   CP (cerebral palsy) ([place]) 07/29/2021   Intellectual disability 07/29/2021   Intertrochanteric fracture of right hip ([place]) 07/29/2021   Fracture of femoral neck, right ([place]) 07/29/2021   Femoral neck fracture ([place]) 07/29/2021   Adjustment disorder with depressed mood 07/13/2021   GAD (generalized anxiety disorder) 07/13/2021   Insomnia 07/13/2021   Dementia ([place]) 03/08/2021    Hypertension 02/26/2017   Moderate protein-calorie malnutrition ([place]) 02/26/2017   Past Medical History:  Diagnosis Date   Cerebral palsy ([place])    Hyperprolactinemia (H[facility])    Hypertension    Mental retardation    Stroke ([place])     Family History  Family history unknown: Yes    Past Surgical History:  Procedure Laterality Date   NO PAST SURGERIES     unknown   TOTAL HIP ARTHROPLASTY Right 08/01/2021   Procedure: RIGHT HIP HEMI  ARTHROPLASTY ANTERIOR APPROACH;  Surgeon: Leandrew Koyanagi, MD;  Location: [facility];  Service: Orthopedics;  Laterality: Right;   Social History   Occupational History   Occupation: disabled  Tobacco Use   Smoking status: Former   Smokeless tobacco: Never  Scientific laboratory technician Use: Never used  Substance and Sexual Activity   Alcohol use: No   Drug use: Never   Sexual activity: Not on file

## 2021-09-11 ENCOUNTER — Ambulatory Visit (INDEPENDENT_AMBULATORY_CARE_PROVIDER_SITE_OTHER): Payer: Medicare Other

## 2021-09-11 ENCOUNTER — Encounter: Payer: Self-pay | Admitting: Orthopaedic Surgery

## 2021-09-11 ENCOUNTER — Ambulatory Visit (INDEPENDENT_AMBULATORY_CARE_PROVIDER_SITE_OTHER): Payer: Medicare Other | Admitting: Orthopaedic Surgery

## 2021-09-11 ENCOUNTER — Other Ambulatory Visit: Payer: Self-pay

## 2021-09-11 VITALS — Ht 72.0 in | Wt 187.0 lb

## 2021-09-11 DIAGNOSIS — M25551 Pain in right hip: Secondary | ICD-10-CM | POA: Diagnosis not present

## 2021-09-11 NOTE — Progress Notes (Signed)
   Post-Op Visit Note   Patient: Marvin Mahoney           Date of Birth: Jun 21, 1961           MRN: [id number] Visit Date: 09/11/2021 PCP: Pcp, No   Assessment & Plan:  Chief Complaint:  Chief Complaint  Patient presents with   Right Hip - Follow-up    Right hemi 08/01/2021   Visit Diagnoses:  1. Pain in right hip     Plan: [place] is 6 weeks status post right hip hemiarthroplasty for femoral neck fracture.  He is doing well has no complaints.  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  Surgical scars fully healed.  He has good range of motion of the hip without any pain.  No signs of infection.  X-rays are unremarkable.  From my standpoint he has recovered from the surgery well and is not in any pain.  He will continue to recover and recuperate and do physical therapy.  He can discontinue any DVT prophylaxis for the surgery.  We will see him back as needed.  Follow-Up Instructions: Return if symptoms worsen or fail to improve.   Orders:  Orders Placed This Encounter  Procedures   XR Pelvis 1-2 Views   No orders of the defined types were placed in this encounter.   Imaging: XR Pelvis 1-2 Views  Result Date: 09/11/2021 Stable right hip hemiarthroplasty without any complications.   PMFS History: Patient Active Problem List   Diagnosis Date Noted   CP (cerebral palsy) ([place]) 07/29/2021   Intellectual disability 07/29/2021   Intertrochanteric fracture of right hip ([place]) 07/29/2021   Fracture of femoral neck, right ([place]) 07/29/2021   Femoral neck fracture (HCC) 07/29/2021   Adjustment disorder with depressed mood 07/13/2021   GAD (generalized anxiety disorder) 07/13/2021   Insomnia 07/13/2021   Dementia ([place]) 03/08/2021   Hypertension 02/26/2017   Moderate protein-calorie malnutrition ([place]) 02/26/2017   Past Medical History:  Diagnosis Date   Cerebral palsy ([place])    Hyperprolactinemia ([place])    Hypertension    Mental retardation    Stroke  ([place])     Family History  Family history unknown: Yes    Past Surgical History:  Procedure Laterality Date   NO PAST SURGERIES     unknown   TOTAL HIP ARTHROPLASTY Right 08/01/2021   Procedure: RIGHT HIP HEMI  ARTHROPLASTY ANTERIOR APPROACH;  Surgeon: Leandrew Koyanagi, MD;  Location: [facility];  Service: Orthopedics;  Laterality: Right;   Social History   Occupational History   Occupation: disabled  Tobacco Use   Smoking status: Former   Smokeless tobacco: Never  Scientific laboratory technician Use: Never used  Substance and Sexual Activity   Alcohol use: No   Drug use: Never   Sexual activity: Not on file

## 2022-03-09 ENCOUNTER — Other Ambulatory Visit: Payer: Self-pay

## 2022-03-09 ENCOUNTER — Encounter (HOSPITAL_COMMUNITY): Payer: Self-pay | Admitting: *Deleted

## 2022-03-09 ENCOUNTER — Ambulatory Visit (HOSPITAL_COMMUNITY)
Admission: EM | Admit: 2022-03-09 | Discharge: 2022-03-09 | Disposition: A | Discharge status: Home / Self Care | Payer: Medicare Other

## 2022-03-09 ENCOUNTER — Ambulatory Visit (INDEPENDENT_AMBULATORY_CARE_PROVIDER_SITE_OTHER): Payer: Medicare Other

## 2022-03-09 DIAGNOSIS — S8001XA Contusion of right knee, initial encounter: Secondary | ICD-10-CM

## 2022-03-09 DIAGNOSIS — M25561 Pain in right knee: Secondary | ICD-10-CM | POA: Diagnosis not present

## 2022-03-09 MED ORDER — IBUPROFEN 800 MG PO TABS: 800.0000 mg | ORAL_TABLET | Freq: Three times a day (TID) | ORAL | 0 refills | Status: AC | PRN

## 2022-03-09 NOTE — ED Provider Notes (Signed)
?[facility] ? ? ? ?CSN: [id number] ?Arrival date & time: 03/09/22  1541 ? ? ?  ? ?History   ?Chief Complaint ?Chief Complaint  ?Patient presents with  ? Knee Pain  ?  Rt  ? ? ?HPI ?Ifeoluwa Tungate is a 61 y.o. male.  ? ?The patient is a 61 year old male who presents with right knee pain.  The patient is intellectually and developmentally challenged.  He does have his caregiver here with him as he resides in a group home setting.  The patient is alert and oriented, and able to provide most information for his visit.  States he was at his day program on yesterday when he was walking in and hit his right knee on the door.  The caregiver reports that the patient does walk with a limp at baseline, but he has been moving more slowly since he injured his knee.  The patient has a history of cerebral palsy, and right side of his body is smaller than the left side.  He does have tenderness to the right knee.  Denies any swelling, numbness, tingling, thigh pain, or ankle pain.  The caregiver reports that he did give him some Tylenol for symptoms, but it did not help.   ? ? ? ?Past Medical History:  ?Diagnosis Date  ? Cerebral palsy ([place])   ? Hyperprolactinemia ([place])   ? Hypertension   ? Mental retardation   ? Stroke [facility])   ? ? ?Patient Active Problem List  ? Diagnosis Date Noted  ? CP (cerebral palsy) ([place]) 07/29/2021  ? Intellectual disability 07/29/2021  ? Intertrochanteric fracture of right hip ([place]) 07/29/2021  ? Fracture of femoral neck, right ([place]) 07/29/2021  ? Femoral neck fracture ([place]) 07/29/2021  ? Adjustment disorder with depressed mood 07/13/2021  ? GAD (generalized anxiety disorder) 07/13/2021  ? Insomnia 07/13/2021  ? Dementia ([place]) 03/08/2021  ? Hypertension 02/26/2017  ? Moderate protein-calorie malnutrition ([place]) 02/26/2017  ? ? ?Past Surgical History:  ?Procedure Laterality Date  ? NO PAST SURGERIES    ? unknown  ? TOTAL HIP ARTHROPLASTY Right 08/01/2021  ? Procedure: RIGHT HIP HEMI  ARTHROPLASTY  ANTERIOR APPROACH;  Surgeon: Leandrew Koyanagi, MD;  Location: [facility];  Service: Orthopedics;  Laterality: Right;  ? ? ? ? ? ?Home Medications   ? ?Prior to Admission medications   ?Medication Sig Start Date End Date Taking? Authorizing Provider  ?Cyanocobalamin 2500 MCG SUBL Place under the tongue. 01/15/22 04/15/22 Yes [provider]  ?alendronate (FOSAMAX) 70 MG tablet Take 70 mg by mouth once a week. 02/18/22   [provider]  ?PARoxetine (PAXIL) 20 MG tablet Take 60 mg by mouth at bedtime.     [provider]  ?risperiDONE (RISPERDAL M-TABS) 2 MG disintegrating tablet Take 1 tablet (2 mg total) by mouth at bedtime. 08/03/21 09/02/21  Harvie Heck, MD  ?rivaroxaban (XARELTO) 10 MG TABS tablet Take 1 tablet (10 mg total) by mouth daily for 28 days. 08/04/21 09/01/21  Harvie Heck, MD  ?tamsulosin (FLOMAX) 0.4 MG CAPS capsule Take 0.4 mg by mouth in the morning.    [provider]  ? ? ?Family History ?Family History  ?Family history unknown: Yes  ? ? ?Social History ?Social History  ? ?Tobacco Use  ? Smoking status: Former  ? Smokeless tobacco: Never  ?Vaping Use  ? Vaping Use: Never used  ?Substance Use Topics  ? Alcohol use: No  ? Drug use: Never  ? ? ? ?Allergies   ?  Patient has no known allergies. ? ? ?Review of Systems ?Review of Systems  ?Constitutional: Negative.   ?Respiratory: Negative.    ?Cardiovascular: Negative.   ?Musculoskeletal:  Negative for joint swelling.  ?     Right knee pain  ?Skin: Negative.   ?Psychiatric/Behavioral: Negative.    ? ? ?Physical Exam ?Triage Vital Signs ?ED Triage Vitals [03/09/22 1617]  ?Enc Vitals Group  ?   BP 110/73  ?   Pulse Rate 82  ?   Resp 18  ?   Temp 98.2 ?F (36.8 ?C)  ?   Temp src   ?   SpO2 94 %  ?   Weight   ?   Height   ?   Head Circumference   ?   Peak Flow   ?   Pain Score   ?   Pain Loc   ?   Pain Edu?   ?   Excl. in [place]?   ? ?No data found. ? ?Updated Vital Signs ?BP 110/73   Pulse 82   Temp 98.2 ?F (36.8 ?C)   Resp 18   SpO2  94%  ? ?Visual Acuity ?Right Eye Distance:   ?Left Eye Distance:   ?Bilateral Distance:   ? ?Right Eye Near:   ?Left Eye Near:    ?Bilateral Near:    ? ?Physical Exam ?Vitals reviewed.  ?Constitutional:   ?   General: He is not in acute distress. ?   Appearance: Normal appearance.  ?HENT:  ?   Head: Normocephalic and atraumatic.  ?Cardiovascular:  ?   Rate and Rhythm: Normal rate and regular rhythm.  ?   Pulses: Normal pulses.  ?Pulmonary:  ?   Effort: Pulmonary effort is normal.  ?   Breath sounds: Normal breath sounds.  ?Abdominal:  ?   Palpations: Abdomen is soft.  ?   Tenderness: There is no abdominal tenderness.  ?Musculoskeletal:  ?   Right upper leg: Normal.  ?   Right knee: Decreased range of motion. Tenderness present over the patellar tendon. No medial joint line or lateral joint line tenderness.  ?   Right ankle: Normal.  ?Skin: ?   General: Skin is warm and dry.  ?Neurological:  ?   Mental Status: He is alert.  ? ? ? ?UC Treatments / Results  ?Labs ?(all labs ordered are listed, but only abnormal results are displayed) ?Labs Reviewed - No data to display ? ?EKG ? ? ?Radiology ?DG Knee Complete 4 Views Right ? ?Result Date: 03/09/2022 ?CLINICAL DATA:  Right knee pain after hitting it on a door. EXAM: RIGHT KNEE - COMPLETE 4+ VIEW COMPARISON:  Right knee x-rays dated July 28, 2021. FINDINGS: Suboptimal lateral view. No evidence of fracture, dislocation, or joint effusion. No evidence of arthropathy or other focal bone abnormality. Soft tissues are unremarkable. IMPRESSION: Negative. Electronically Signed   By: Titus Dubin M.D.   On: 03/09/2022 17:15   ? ?Procedures ?Procedures (including critical care time) ? ?Medications Ordered in UC ?Medications - No data to display ? ?Initial Impression / Assessment and Plan / UC Course  ?I have reviewed the triage vital signs and the nursing notes. ? ?Pertinent labs & imaging results that were available during my care of the patient were reviewed by me and  considered in my medical decision making (see chart for details). ? ?The patient is a 61 year old male who presents with injury to the right knee after he hit it with the door 1 day  ago.  The patient's caregiver states that the patient has been complaining of right knee pain and has been walking more slowly since the onset of his symptoms.  Patient has been able to bear weight, and denies swelling at this time.  X-ray conducted shows no dislocation or fracture.  The patient's symptoms are consistent with a right knee contusion.  We will recommend rice therapy until his symptoms improved to include rest, ice, compression, and elevation.  We will provide the patient with an Ace wrap to the right knee for comfort and support.  He can continue over-the-counter Tylenol or ibuprofen for pain or discomfort.  Discussed with the caregiver that a contusion can take several weeks to heal.  Follow-up with his primary care physician if symptoms worsen. ?Final Clinical Impressions(s) / UC Diagnoses  ? ?Final diagnoses:  ?None  ? ?Discharge Instructions   ?None ?  ? ?ED Prescriptions   ?None ?  ? ?PDMP not reviewed this encounter. ?  ?Tish Men, NP ?03/09/22 1745 ? ?

## 2022-03-09 NOTE — ED Triage Notes (Signed)
Pt arrives with Care giver with reported Rt knee pain. Pt is walking with a limp. Pt has CP and  the entire RT side is smaller.  ?

## 2022-03-09 NOTE — Discharge Instructions (Addendum)
Your x-rays are negative today for fracture or dislocation.  Based on the mechanism of injury, you have bruised the bone on the right knee. ?Therapy, rest, ice, compression and elevation. ?May continue Tylenol or ibuprofen for pain, fever, or general discomfort. ?Follow-up with your primary care or orthopedics if symptoms worsen or do not improve. ? ?

## 2022-03-13 ENCOUNTER — Encounter (HOSPITAL_COMMUNITY): Payer: Self-pay

## 2022-03-13 ENCOUNTER — Other Ambulatory Visit: Payer: Self-pay

## 2022-03-13 ENCOUNTER — Emergency Department (HOSPITAL_COMMUNITY): Payer: Medicare Other

## 2022-03-13 ENCOUNTER — Emergency Department (HOSPITAL_COMMUNITY)
Admission: EM | Admit: 2022-03-13 | Discharge: 2022-03-13 | Disposition: A | Discharge status: Skilled Nursing Facility | Payer: Medicare Other | Attending: Emergency Medicine | Admitting: Emergency Medicine

## 2022-03-13 DIAGNOSIS — I10 Essential (primary) hypertension: Secondary | ICD-10-CM | POA: Insufficient documentation

## 2022-03-13 DIAGNOSIS — Z79899 Other long term (current) drug therapy: Secondary | ICD-10-CM | POA: Diagnosis not present

## 2022-03-13 DIAGNOSIS — M25561 Pain in right knee: Secondary | ICD-10-CM | POA: Diagnosis present

## 2022-03-13 DIAGNOSIS — F0390 Unspecified dementia without behavioral disturbance: Secondary | ICD-10-CM | POA: Insufficient documentation

## 2022-03-13 MED ORDER — KETOROLAC TROMETHAMINE 60 MG/2ML IM SOLN
30.0000 mg | Freq: Once | INTRAMUSCULAR | Status: AC
  Administered 2022-03-13: 30 mg via INTRAMUSCULAR
  Filled 2022-03-13: qty 2

## 2022-03-13 NOTE — ED Triage Notes (Signed)
Via EMS. Pt from group home. Hx of cerebral palsy and intellectual deficit. Per EMS injury R knee x 2 days, unknown mechanism. Seen by PCP. Pt ambulatory but reports pain.   ?

## 2022-03-13 NOTE — ED Notes (Signed)
RN gave report to facility  ?

## 2022-03-13 NOTE — ED Notes (Signed)
Pt ambulatory with standby assist to bathroom  ?

## 2022-03-13 NOTE — ED Notes (Signed)
Dinner tray at bedside

## 2022-03-13 NOTE — ED Notes (Signed)
Pt given PO fluid/food at this time  ?

## 2022-03-13 NOTE — ED Notes (Signed)
RN spoke with social work Biomedical engineer. RN updated him on POC and discharge.  ?

## 2022-03-13 NOTE — ED Notes (Signed)
Patient verbalizes understanding of d/c instructions. Opportunities for questions and answers were provided. Previous RN called facility and communicated discharge instructions with RN. Pt d/c from ED and transferred back to facility via [facility]. ? ?

## 2022-03-13 NOTE — ED Notes (Signed)
RN called [facility], no answer, message left will call back  ?

## 2022-03-13 NOTE — ED Notes (Signed)
Pt ambulatory x 1 standby assistance to restroom  ?

## 2022-03-13 NOTE — Discharge Instructions (Signed)
Continue RICE therapy: Rest, ice, compression, elevation.  Take ibuprofen and Tylenol for pain.  Wear knee brace when upright and walking.  Continue to follow-up with the orthopedic surgery office.  They will be the ones to determine if you need advanced imaging.  They typically allow you some time to see if the pain and swelling resolves on its own. ?

## 2022-03-13 NOTE — ED Notes (Signed)
Patient transported to X-ray 

## 2022-03-13 NOTE — ED Notes (Signed)
Back from xr

## 2022-03-13 NOTE — ED Provider Notes (Signed)
?[facility] ?Provider Note ? ? ?CSN: [id number] ?Arrival date & time: 03/13/22  1113 ? ?  ? ?History ? ?Chief Complaint  ?Patient presents with  ? Knee Pain  ? ? ?Marvin Mahoney is a 61 y.o. male. ? ? ?Knee Pain ?Patient presents for knee pain.  His medical history includes cerebral palsy, hypertension, cognitive delay, dementia, anxiety.  He was seen in urgent care 4 days ago.  At that time as a caregiver reported that he struck his right knee on a door.  X-ray imaging of that time was negative.  He was advised to do RICE therapy and Ace wrap was provided.  He was seen again at a different urgent care 2 days ago.  At that time, he reported that he has been ambulatory but feels pain and unsteadiness in his knee.  He was provided a knee brace and advised to follow-up with orthopedic surgery.  He was seen by orthopedic surgery yesterday.  He has not had any new injuries. ?  ? ?Home Medications ?Prior to Admission medications   ?Medication Sig Start Date End Date Taking? Authorizing Provider  ?alendronate (FOSAMAX) 70 MG tablet Take 70 mg by mouth once a week. 02/18/22   [provider]  ?Cyanocobalamin 2500 MCG SUBL Place under the tongue. 01/15/22 04/15/22  [provider]  ?ibuprofen (ADVIL) 800 MG tablet Take 1 tablet (800 mg total) by mouth every 8 (eight) hours as needed for up to 10 days for moderate pain. 03/09/22 03/19/22  Leath-Warren, Alda Lea, NP  ?PARoxetine (PAXIL) 20 MG tablet Take 60 mg by mouth at bedtime.     [provider]  ?risperiDONE (RISPERDAL M-TABS) 2 MG disintegrating tablet Take 1 tablet (2 mg total) by mouth at bedtime. 08/03/21 09/02/21  Harvie Heck, MD  ?rivaroxaban (XARELTO) 10 MG TABS tablet Take 1 tablet (10 mg total) by mouth daily for 28 days. 08/04/21 09/01/21  Harvie Heck, MD  ?tamsulosin (FLOMAX) 0.4 MG CAPS capsule Take 0.4 mg by mouth in the morning.    [provider]  ?   ? ?Allergies    ?Patient has no known  allergies.   ? ?Review of Systems   ?Review of Systems  ?Musculoskeletal:  Positive for arthralgias.  ?All other systems reviewed and are negative. ? ?Physical Exam ?Updated Vital Signs ?BP 111/81   Pulse 63   Temp 98.5 ?F (36.9 ?C) (Oral)   Resp 17   Ht 6' (1.829 m)   Wt 90.7 kg   SpO2 100%   BMI 27.12 kg/m?  ?Physical Exam ?Vitals and nursing note reviewed.  ?Constitutional:   ?   General: He is not in acute distress. ?   Appearance: Normal appearance. He is well-developed. He is not ill-appearing, toxic-appearing or diaphoretic.  ?HENT:  ?   Head: Normocephalic and atraumatic.  ?   Right Ear: External ear normal.  ?   Left Ear: External ear normal.  ?   Nose: Nose normal.  ?Eyes:  ?   Extraocular Movements: Extraocular movements intact.  ?   Conjunctiva/sclera: Conjunctivae normal.  ?Cardiovascular:  ?   Rate and Rhythm: Normal rate and regular rhythm.  ?   Heart sounds: No murmur heard. ?Pulmonary:  ?   Effort: Pulmonary effort is normal. No respiratory distress.  ?Abdominal:  ?   General: There is no distension.  ?   Palpations: Abdomen is soft.  ?Musculoskeletal:     ?   General: Swelling and tenderness present.  ?  Cervical back: No rigidity.  ?Skin: ?   General: Skin is warm and dry.  ?   Capillary Refill: Capillary refill takes less than 2 seconds.  ?   Coloration: Skin is not jaundiced or pale.  ?Neurological:  ?   General: No focal deficit present.  ?   Mental Status: He is alert. Mental status is at baseline.  ?Psychiatric:     ?   Mood and Affect: Mood normal.     ?   Behavior: Behavior normal.  ? ? ?ED Results / Procedures / Treatments   ?Labs ?(all labs ordered are listed, but only abnormal results are displayed) ?Labs Reviewed - No data to display ? ?EKG ?None ? ?Radiology ?DG Knee Complete 4 Views Right ? ?Result Date: 03/13/2022 ?CLINICAL DATA:  Anterior knee pain for 2 days without trauma EXAM: RIGHT KNEE - COMPLETE 4+ VIEW COMPARISON:  03/09/2022 FINDINGS: No acute fracture or dislocation.  Mild joint space narrowing and osteophyte formation about the patellofemoral compartment. No joint effusion. IMPRESSION: No acute osseous abnormality. Electronically Signed   By: Abigail Miyamoto M.D.   On: 03/13/2022 13:24   ? ?Procedures ?Procedures  ? ? ?Medications Ordered in ED ?Medications  ?ketorolac (TORADOL) injection 30 mg (30 mg Intramuscular Given 03/13/22 1300)  ? ? ?ED Course/ Medical Decision Making/ A&P ?  ?                        ?Medical Decision Making ?Amount and/or Complexity of Data Reviewed ?Radiology: ordered. ? ?Risk ?Prescription drug management. ? ? ?This patient is a 61 year old male who has a history of cerebral palsy and suffered an injury to his right knee 4 days ago.  He was seen in urgent care 4 days ago, seen at a different urgent care 2 days ago, and seen by orthopedic surgery yesterday.  He has not suffered any new injuries since the initial injury 4 days ago.  On arrival in the ED, he is well-appearing.  He does have swelling to the area of his right knee and some mild tenderness.  He is ambulatory on his right leg.  He arrives with a knee brace that he received from urgent care 2 days ago.  Patient was given Toradol for analgesia.  An x-ray of his right knee was obtained.  Similar to previous x-rays, no acute osseous findings are identified.  I spoke with his legal guardian, Mr. Barnie Alderman, who was not sure why the patient was brought to the emergency department.  I did inform him of the patient's need to continue RICE therapy and to follow-up with orthopedic surgery if he does have persistent pain.  Legal guardian was informed that patient will be discharged back to his group home.  Nursing reached out to his group home point of contact, Debbe Bales.  Mr. Sharlett Iles stated that the patient was brought to the emergency department to obtain an MRI of his knee.  Mr. Sharlett Iles was informed that patient will need to continue to follow-up with orthopedic surgery to determine any need  of advanced imaging.  Patient was discharged in good condition.  He remained in the ED awaiting [facility] transport back to his group home. ? ? ? ? ? ? ? ?Final Clinical Impression(s) / ED Diagnoses ?Final diagnoses:  ?Acute pain of right knee  ? ? ?Rx / DC Orders ?ED Discharge Orders   ? ? None  ? ?  ? ? ?  ?Godfrey Pick, MD ?  03/13/22 1725 ? ?

## 2022-03-14 ENCOUNTER — Other Ambulatory Visit: Payer: Self-pay

## 2022-03-14 ENCOUNTER — Encounter (HOSPITAL_COMMUNITY): Payer: Self-pay

## 2022-03-14 ENCOUNTER — Emergency Department (HOSPITAL_COMMUNITY)
Admission: EM | Admit: 2022-03-14 | Discharge: 2022-03-14 | Disposition: A | Discharge status: Home / Self Care | Payer: Medicare Other | Attending: Emergency Medicine | Admitting: Emergency Medicine

## 2022-03-14 DIAGNOSIS — Z593 Problems related to living in residential institution: Secondary | ICD-10-CM | POA: Insufficient documentation

## 2022-03-14 MED ORDER — IBUPROFEN 400 MG PO TABS: 600.0000 mg | ORAL_TABLET | Freq: Four times a day (QID) | ORAL | Status: DC | PRN

## 2022-03-14 MED ORDER — PAROXETINE HCL 30 MG PO TABS
60.0000 mg | ORAL_TABLET | Freq: Every day | ORAL | Status: DC
  Administered 2022-03-14: 60 mg via ORAL
  Filled 2022-03-14: qty 2

## 2022-03-14 MED ORDER — TAMSULOSIN HCL 0.4 MG PO CAPS
0.4000 mg | ORAL_CAPSULE | Freq: Every day | ORAL | Status: DC
  Administered 2022-03-14: 0.4 mg via ORAL
  Filled 2022-03-14: qty 1

## 2022-03-14 MED ORDER — RISPERIDONE 2 MG PO TABS
2.0000 mg | ORAL_TABLET | Freq: Every day | ORAL | Status: DC
  Administered 2022-03-14: 2 mg via ORAL
  Filled 2022-03-14: qty 1

## 2022-03-14 NOTE — ED Triage Notes (Signed)
Pt arrives via [facility]; the patient was seen here earlier this evening for knee pain and d/c'd back to his group home but when [facility] and the pt arrived at his group home there was no one there and the patient is unable to care for himself so they had to bring the patient back to the ED. Denies complaints.  ?BP-127/77, HR-59, 18-RR, 100% RA. ?

## 2022-03-14 NOTE — ED Notes (Signed)
This RN tried to reach [facility] again with no answer  ?

## 2022-03-14 NOTE — ED Notes (Signed)
Pt took PO pills with no issues. Pt offered toileting, pt said he had to pee, refused urinal - pt assisted to restroom, found to have BM on self - pt changed into paper scrubs and clean brief at this time - pt back in bed, declines any other needs at this time  ?

## 2022-03-14 NOTE — ED Notes (Signed)
[facility] called pt next in line for pickup  ?

## 2022-03-14 NOTE — ED Provider Notes (Addendum)
Care of patient handed off to me by Wyn Quaker, PA-C at change of shift.  Please see her note for full work-up of patient.  Briefly the patient has been completely worked up and discharged.  He is currently asymptomatic with no complaints.  He was discharged back to his group home, however nobody was there to accept the patient so EMS brought him back to the emergency department.  His home medications have been ordered.  Social work has been consulted. ?Physical Exam  ?BP 122/82   Pulse (!) 53   Temp 97.9 ?F (36.6 ?C)   Resp 18   SpO2 100%  ? ?Physical Exam ?Vitals and nursing note reviewed.  ?Constitutional:   ?   General: He is not in acute distress. ?   Appearance: Normal appearance.  ?HENT:  ?   Head: Normocephalic and atraumatic.  ?Eyes:  ?   General: No scleral icterus. ?Pulmonary:  ?   Effort: Pulmonary effort is normal. No respiratory distress.  ?Skin: ?   General: Skin is warm and dry.  ?   Findings: No rash.  ?Neurological:  ?   General: No focal deficit present.  ?   Mental Status: He is alert. Mental status is at baseline.  ?Psychiatric:     ?   Mood and Affect: Mood normal.     ?   Behavior: Behavior normal.     ?   Thought Content: Thought content normal.     ?   Judgment: Judgment normal.  ? ? ?Procedures  ?Procedures ? ?ED Course / MDM  ? ?Clinical Course as of 03/14/22 0628  ?Thu Mar 14, 2022  ?6 I spoke with patients guardian Barnie Alderman, he is aware patient is back in the ER.  He gives permission to give patient his daily meds.  [EH]  ?0219 I ordered patients home meds, RN informed.  [EH]  ?  ?Clinical Course User Index ?[EH] Lorin Glass, PA-C  ? ?Medical Decision Making ?Risk ?Prescription drug management. ? ? ?61: Spoke with Larene Beach, with social worker states that she was able to get in touch with group home and patient will be discharged back to group home via [facility].  ?0905: Called and spoke with Barnie Alderman, patient legal guardian and updated him that the patient will be  discharged back to group home shortly via [facility].  I have answered his questions at this time. ? ? ?  ?Mickie Hillier, PA-C ?03/14/22 [phone] ? ?  ?Mickie Hillier, PA-C ?03/14/22 0908 ? ?  ?Tegeler, Gwenyth Allegra, MD ?03/14/22 1119 ? ?

## 2022-03-14 NOTE — Progress Notes (Signed)
CSW contacted patients legal guardian Leeroy Bock to get contact information from the group home. CSW was told to call Debbe Bales at [phone]. Mr Sharlett Iles stated he would be home in 40 minutes. CSW notified patients nurse to contact [facility].  ?

## 2022-03-14 NOTE — ED Notes (Signed)
[facility] called, no ETA given ?

## 2022-03-14 NOTE — ED Notes (Signed)
RN attempted to contact [facility] with no answer.  ?

## 2022-03-14 NOTE — ED Provider Notes (Signed)
?[name] ?Provider Note ? ? ?CSN: [id number] ?Arrival date & time: 03/14/22  0002 ? ?  ? ?History ? ?Chief Complaint  ?Patient presents with  ? [place]  ? ? ?Marvin Mahoney is a 61 y.o. male who presents back to the emergency room.  He was discharged from the emergency room and per [facility] at the facility there was no one to take care of with patient and as he requires assistance they did not feel safe leaving him there and brought him back to the emergency room.  Patient denies any complaints or concerns. ? ?HPI ? ?  ? ?Home Medications ?Prior to Admission medications   ?Medication Sig Start Date End Date Taking? Authorizing Provider  ?alendronate (FOSAMAX) 70 MG tablet Take 70 mg by mouth once a week. 02/18/22   [provider]  ?Cyanocobalamin 2500 MCG SUBL Place under the tongue. 01/15/22 04/15/22  [provider]  ?ibuprofen (ADVIL) 800 MG tablet Take 1 tablet (800 mg total) by mouth every 8 (eight) hours as needed for up to 10 days for moderate pain. 03/09/22 03/19/22  Leath-Warren, Alda Lea, NP  ?PARoxetine (PAXIL) 20 MG tablet Take 60 mg by mouth at bedtime.     [provider]  ?risperiDONE (RISPERDAL M-TABS) 2 MG disintegrating tablet Take 1 tablet (2 mg total) by mouth at bedtime. 08/03/21 09/02/21  Harvie Heck, MD  ?rivaroxaban (XARELTO) 10 MG TABS tablet Take 1 tablet (10 mg total) by mouth daily for 28 days. 08/04/21 09/01/21  Harvie Heck, MD  ?tamsulosin (FLOMAX) 0.4 MG CAPS capsule Take 0.4 mg by mouth in the morning.    [provider]  ?   ? ?Allergies    ?Patient has no known allergies.   ? ?Review of Systems   ?Review of Systems ? ?Physical Exam ?Updated Vital Signs ?BP 122/82   Pulse (!) 53   Temp 97.9 ?F (36.6 ?C)   Resp 18   SpO2 100%  ?Physical Exam ?Vitals and nursing note reviewed.  ?Constitutional:   ?   General: He is not in acute distress. ?HENT:  ?   Head: Normocephalic and atraumatic.  ?Cardiovascular:  ?   Rate and  Rhythm: Normal rate.  ?Pulmonary:  ?   Effort: Pulmonary effort is normal. No respiratory distress.  ?Musculoskeletal:  ?   Cervical back: No rigidity.  ?Neurological:  ?   Mental Status: He is alert. Mental status is at baseline.  ?   Comments: Awake and alert, answers all questions appropriately.  Speech is not slurred.    ?Psychiatric:     ?   Mood and Affect: Mood normal.  ? ? ?ED Results / Procedures / Treatments   ?Labs ?(all labs ordered are listed, but only abnormal results are displayed) ?Labs Reviewed - No data to display ? ?EKG ?None ? ?Radiology ?DG Knee Complete 4 Views Right ? ?Result Date: 03/13/2022 ?CLINICAL DATA:  Anterior knee pain for 2 days without trauma EXAM: RIGHT KNEE - COMPLETE 4+ VIEW COMPARISON:  03/09/2022 FINDINGS: No acute fracture or dislocation. Mild joint space narrowing and osteophyte formation about the patellofemoral compartment. No joint effusion. IMPRESSION: No acute osseous abnormality. Electronically Signed   By: Abigail Miyamoto M.D.   On: 03/13/2022 13:24   ? ?Procedures ?Procedures  ? ? ?Medications Ordered in ED ?Medications  ?PARoxetine (PAXIL) tablet 60 mg (60 mg Oral Given 03/14/22 0254)  ?risperiDONE (RISPERDAL) tablet 2 mg (2 mg Oral Given 03/14/22 0255)  ?ibuprofen (ADVIL)  tablet 600 mg (has no administration in time range)  ?tamsulosin (FLOMAX) capsule 0.4 mg (has no administration in time range)  ? ? ?ED Course/ Medical Decision Making/ A&P ?Clinical Course as of 03/14/22 0641  ?Thu Mar 14, 2022  ?58 I spoke with patients guardian Marvin Mahoney, he is aware patient is back in the ER.  He gives permission to give patient his daily meds.  [EH]  ?0219 I ordered patients home meds, RN informed.  [EH]  ?  ?Clinical Course User Index ?[EH] Lorin Glass, PA-C  ? ?                        ?Medical Decision Making ?Risk ?Prescription drug management. ? ? ?Patient arrives back to the emergency room.  He was previously discharged however when [facility] attempted to take him back to  his ALF/group home there was no one there and they brought him back.  Patient denies any complaints or concerns. ?I spoke with patient's legal guardian to make him aware of the situation, he gave permission to give patient his routine meds.  On review of his MAR from facility and from his note from his previous visit it does not appear he is gotten his nighttime meds yet.  Med rec is ordered. ?Meal tray is ordered.   ? ?Home meds are ordered.  TOC consulted.   ? ?At shift change patient signed out to Marvin Blaze PA-C.  ? ?Final Clinical Impression(s) / ED Diagnoses ?Final diagnoses:  ?Lives in group home  ? ? ?Rx / DC Orders ?ED Discharge Orders   ? ? None  ? ?  ? ? ?  ?Lorin Glass, [place] ?03/14/22 3785 ? ?  ?Veryl Speak, MD ?03/15/22 [phone] ? ?

## 2022-03-14 NOTE — ED Notes (Signed)
Marvin Mahoney Social worker legal guardian wants an update for pt in regards to transport to facility [phone] ?

## 2022-05-09 ENCOUNTER — Ambulatory Visit
Admission: EM | Admit: 2022-05-09 | Discharge: 2022-05-09 | Disposition: A | Discharge status: Home / Self Care | Payer: Medicare Other | Attending: Emergency Medicine | Admitting: Emergency Medicine

## 2022-05-09 DIAGNOSIS — T50901A Poisoning by unspecified drugs, medicaments and biological substances, accidental (unintentional), initial encounter: Secondary | ICD-10-CM | POA: Diagnosis not present

## 2022-05-09 NOTE — Discharge Instructions (Addendum)
Please drink lots of fluid over the next two days. Monitor for any new symptoms. He may be sleepy for the rest of the day and should be continually monitored for fall prevention.   Please return to the urgent care or emergency department if symptoms worsen or do not improve.

## 2022-05-09 NOTE — ED Triage Notes (Signed)
Patient presents to Urgent Care with [facility] provider who states she accidentally gave pt the wrong medication intended for another. patient at 8: 30 Am.  Abilify 5 mg, alprazolam 1 mg, citalopram HBr 20 mg, clonidine.1, Depakote 500 mg Patient reports no symptoms   Pt normally take amantadine 100 mg, and Flomax 0.4 mg pt care provider did not give these this morning for fear they ay interact.

## 2022-05-09 NOTE — ED Provider Notes (Signed)
[name]    CSN: [id number] Arrival date & time: 05/09/22  1005     History   Chief Complaint Chief Complaint  Patient presents with   wrong medication    HPI Marvin Mahoney is a 61 y.o. male.  Presents with caregiver who provides history. Patient is nursing home resident. Around 8:30 this morning, he reached for medication meant for another resident and took them before he could be stopped. Patient took Abilify 5 mg, alprazolam 1 mg, citalopram 20 mg, clonidine 0.1 mg, Depakote 500 mg. Caregiver reports patient appears more sleepy than usual. Patient states he is tired. No other symptoms noted. Patient denies headache, vision changes, chest pain, shortness of breath, abdominal pain, vomiting/diarrhea. He gets lightheaded if he stands up quickly.   Past Medical History:  Diagnosis Date   Cerebral palsy ([place])    Hyperprolactinemia ([place])    Hypertension    Mental retardation    Stroke [facility])     Patient Active Problem List   Diagnosis Date Noted   CP (cerebral palsy) ([place]) 07/29/2021   Intellectual disability 07/29/2021   Intertrochanteric fracture of right hip ([place]) 07/29/2021   Fracture of femoral neck, right ([place]) 07/29/2021   Femoral neck fracture ([place]) 07/29/2021   Adjustment disorder with depressed mood 07/13/2021   GAD (generalized anxiety disorder) 07/13/2021   Insomnia 07/13/2021   Dementia ([place]) 03/08/2021   Hypertension 02/26/2017   Moderate protein-calorie malnutrition ([place]) 02/26/2017    Past Surgical History:  Procedure Laterality Date   NO PAST SURGERIES     unknown   TOTAL HIP ARTHROPLASTY Right 08/01/2021   Procedure: RIGHT HIP HEMI  ARTHROPLASTY ANTERIOR APPROACH;  Surgeon: Leandrew Koyanagi, MD;  Location: [facility];  Service: Orthopedics;  Laterality: Right;     Home Medications    Prior to Admission medications   Medication Sig Start Date End Date Taking? Authorizing Provider  alendronate (FOSAMAX) 70 MG tablet Take 70 mg by mouth once a  week. 02/18/22   [provider]  PARoxetine (PAXIL) 20 MG tablet Take 60 mg by mouth at bedtime.     [provider]  risperiDONE (RISPERDAL M-TABS) 2 MG disintegrating tablet Take 1 tablet (2 mg total) by mouth at bedtime. 08/03/21 09/02/21  Harvie Heck, MD  rivaroxaban (XARELTO) 10 MG TABS tablet Take 1 tablet (10 mg total) by mouth daily for 28 days. 08/04/21 09/01/21  Harvie Heck, MD  tamsulosin (FLOMAX) 0.4 MG CAPS capsule Take 0.4 mg by mouth in the morning.    [provider]    Family History Family History  Family history unknown: Yes    Social History Social History   Tobacco Use   Smoking status: Former   Smokeless tobacco: Never  Scientific laboratory technician Use: Never used  Substance Use Topics   Alcohol use: No   Drug use: Never     Allergies   Patient has no known allergies.   Review of Systems Review of Systems  Constitutional:  Positive for fatigue.  All other systems reviewed and are negative.  As per HPI  Physical Exam Triage Vital Signs ED Triage Vitals [05/09/22 1120]  Enc Vitals Group     BP 99/72     Pulse Rate 63     Resp 18     Temp 97.6 F (36.4 C)     Temp src      SpO2      Weight      Height  Head Circumference      Peak Flow      Pain Score 0     Pain Loc      Pain Edu?      Excl. in [place]?    No data found.  Updated Vital Signs BP 115/76   Pulse 63   Temp 97.6 F (36.4 C)   Resp 18    Physical Exam Vitals and nursing note reviewed.  Constitutional:      General: He is not in acute distress.    Appearance: He is well-developed.  HENT:     Mouth/Throat:     Mouth: Mucous membranes are moist.     Pharynx: Oropharynx is clear.  Eyes:     Conjunctiva/sclera: Conjunctivae normal.     Pupils: Pupils are equal, round, and reactive to light.  Cardiovascular:     Rate and Rhythm: Normal rate and regular rhythm.     Heart sounds: Normal heart sounds.  Pulmonary:     Effort: Pulmonary effort is  normal. No respiratory distress.     Breath sounds: Normal breath sounds.  Abdominal:     General: Bowel sounds are normal.     Palpations: Abdomen is soft.     Tenderness: There is no abdominal tenderness.  Musculoskeletal:        General: Normal range of motion.     Cervical back: Normal range of motion.  Neurological:     Mental Status: He is alert. Mental status is at baseline.     Cranial Nerves: No facial asymmetry.     Sensory: Sensation is intact.     Motor: Motor function is intact.     Coordination: Coordination is intact.     Comments: Antalgic gate - patient has knee injury and wearing brace. Hx cerebral palsy     UC Treatments / Results  Labs (all labs ordered are listed, but only abnormal results are displayed) Labs Reviewed - No data to display  EKG  Radiology No results found.  Procedures Procedures (including critical care time)  Medications Ordered in UC Medications - No data to display  Initial Impression / Assessment and Plan / UC Course  I have reviewed the triage vital signs and the nursing notes.  Pertinent labs & imaging results that were available during my care of the patient were reviewed by me and considered in my medical decision making (see chart for details).   Suspect patient will be lethargic for rest of day. No other symptoms, mental status at baseline per caregiver. BP trending up in clinic today. Originally 99/72, reevaluation before discharge is 115/76. Discussed with caregiver to monitor for any changes in mentation, monitor while ambulating in case of falls. Encourage to drink lots of water. Caregiver will be with him the rest of the day.  Return precautions discussed. Patient and caregiver agree to plan and patient is discharged in stable condition.  Final Clinical Impressions(s) / UC Diagnoses   Final diagnoses:  Medication administered in error, accidental or unintentional, initial encounter     Discharge Instructions       Please drink lots of fluid over the next two days. Monitor for any new symptoms. He may be sleepy for the rest of the day and should be continually monitored for fall prevention.   Please return to the urgent care or emergency department if symptoms worsen or do not improve.    ED Prescriptions   None    PDMP not reviewed this encounter.  Yaretzi Ernandez, Vernice [place] 05/09/22 1241

## 2022-12-21 IMAGING — CR DG HIP (WITH OR WITHOUT PELVIS) 2-3V*R*
3 series · 3 of 3 positions shown · non-contrast
Comparison: Abdominal radiographs, 07/28/2021

CLINICAL DATA: Fall yesterday, right hip pain

EXAM:
DG HIP (WITH OR WITHOUT PELVIS) 2-3V RIGHT

[pelvis ap]
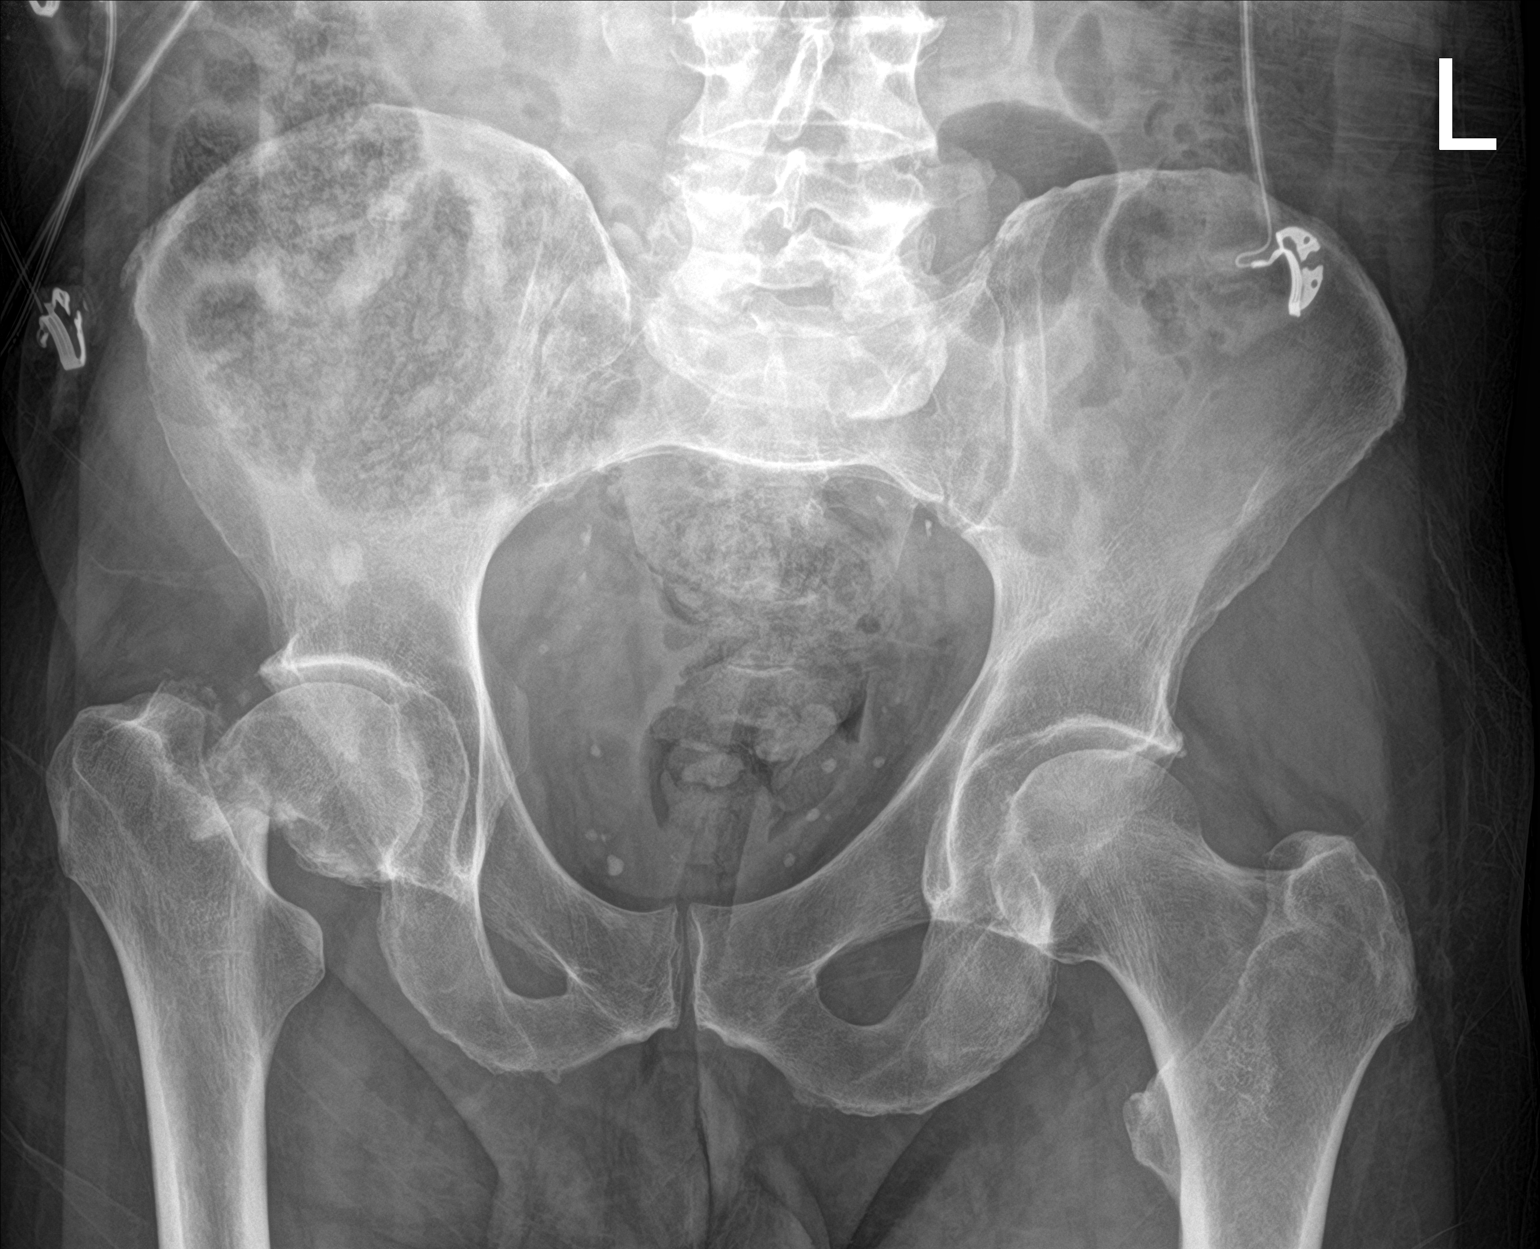

[hip ap]
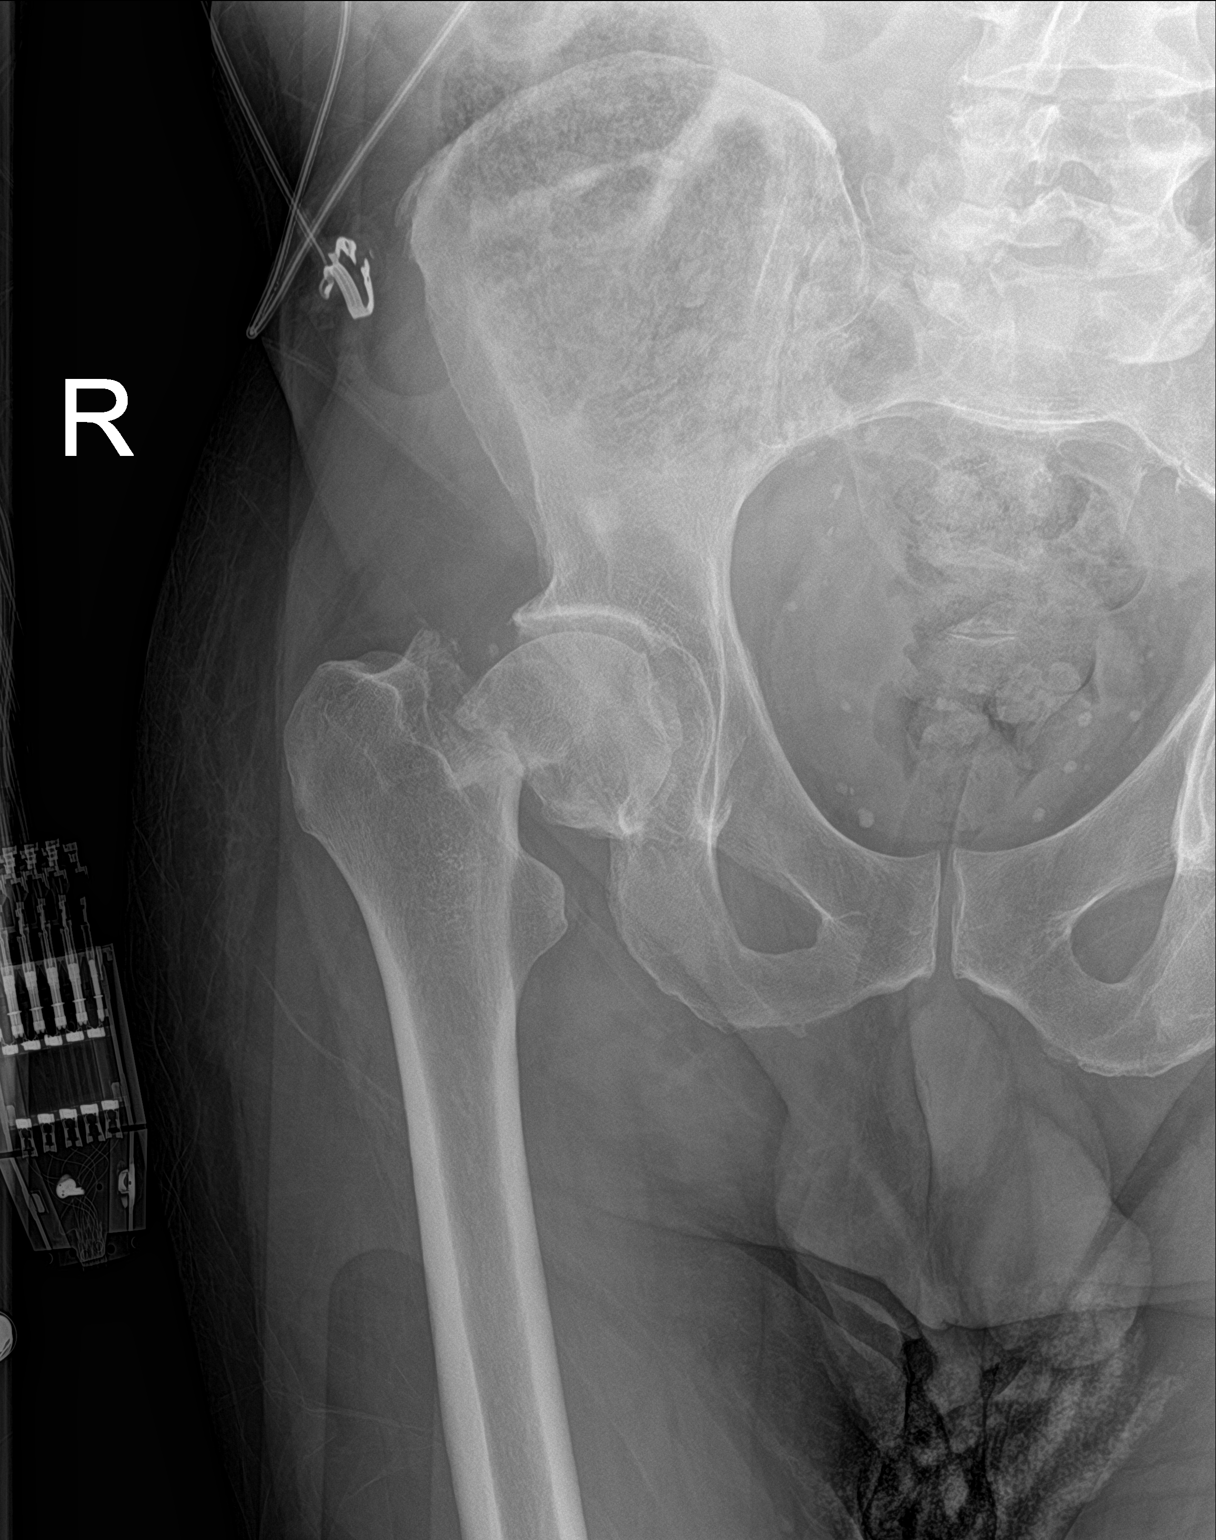

[hip x-table]
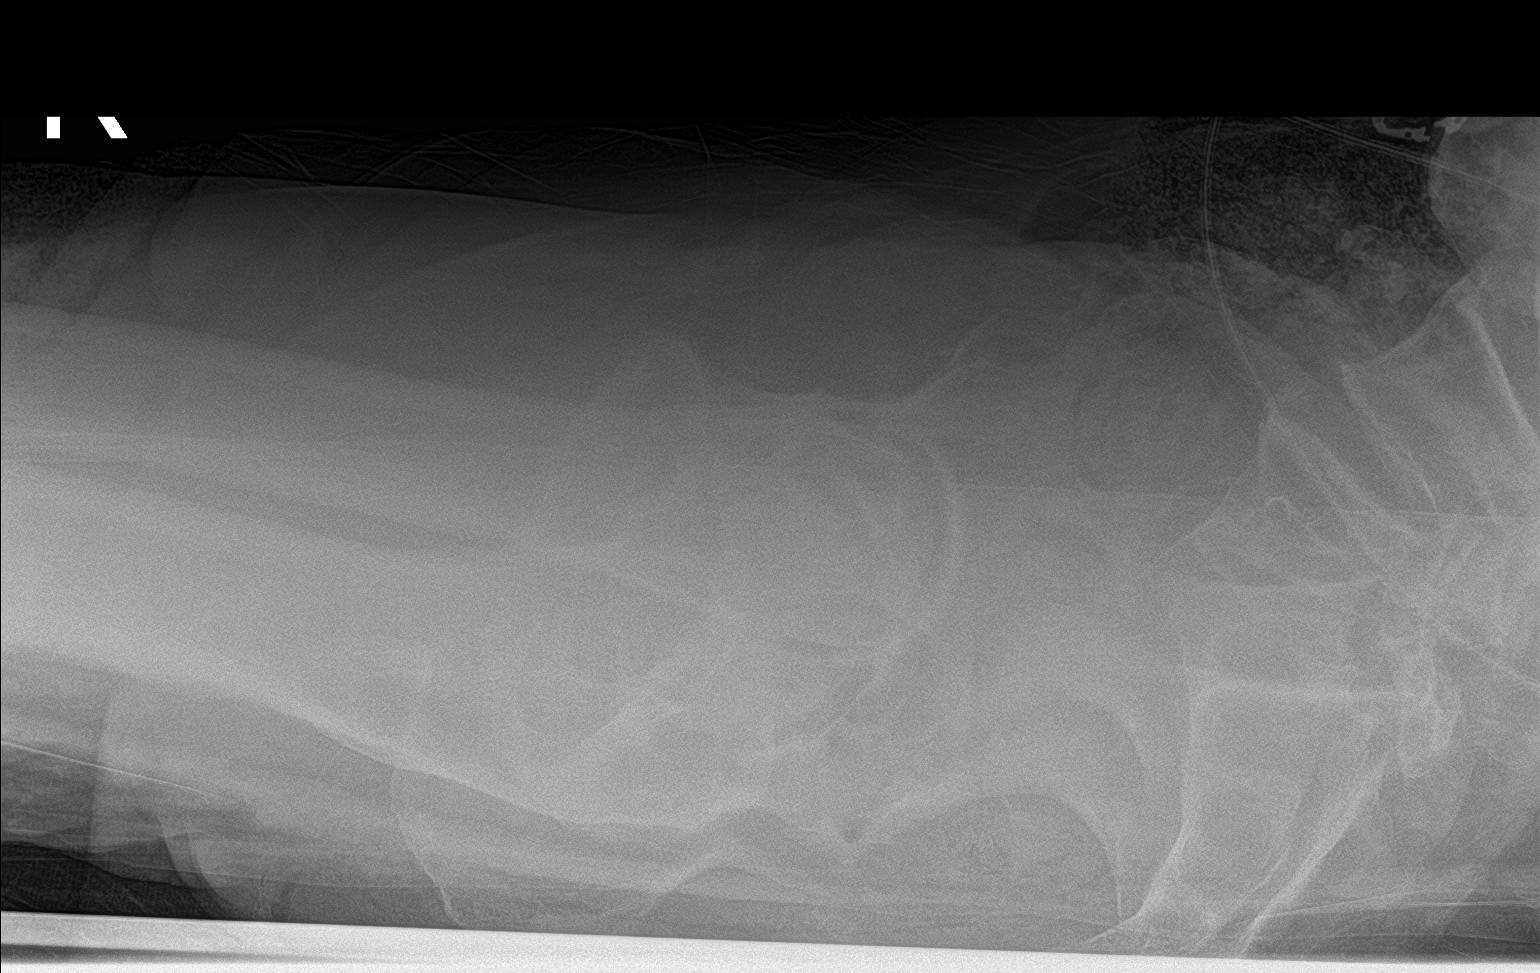

[3 of 3 positions shown; findings below may reference images not displayed]

FINDINGS: There is a displaced and slightly impacted basicervical fracture of
the right femoral neck, which is unchanged compared to prior
examination and subacute to chronic appearing. No other fracture.
IMPRESSION: There is a displaced and slightly impacted basicervical fracture of
the right femoral neck, which is unchanged compared to prior
examination and subacute to chronic appearing. No other fracture.

## 2022-12-21 IMAGING — CR DG CHEST 1V
1 series · 1 of 1 positions shown · non-contrast
Comparison: Chest x-ray 07/28/2021.

CLINICAL DATA: 59-year-old male with history of right-sided hip
pain after a fall yesterday.

EXAM:
CHEST  1 VIEW

[chest ap]
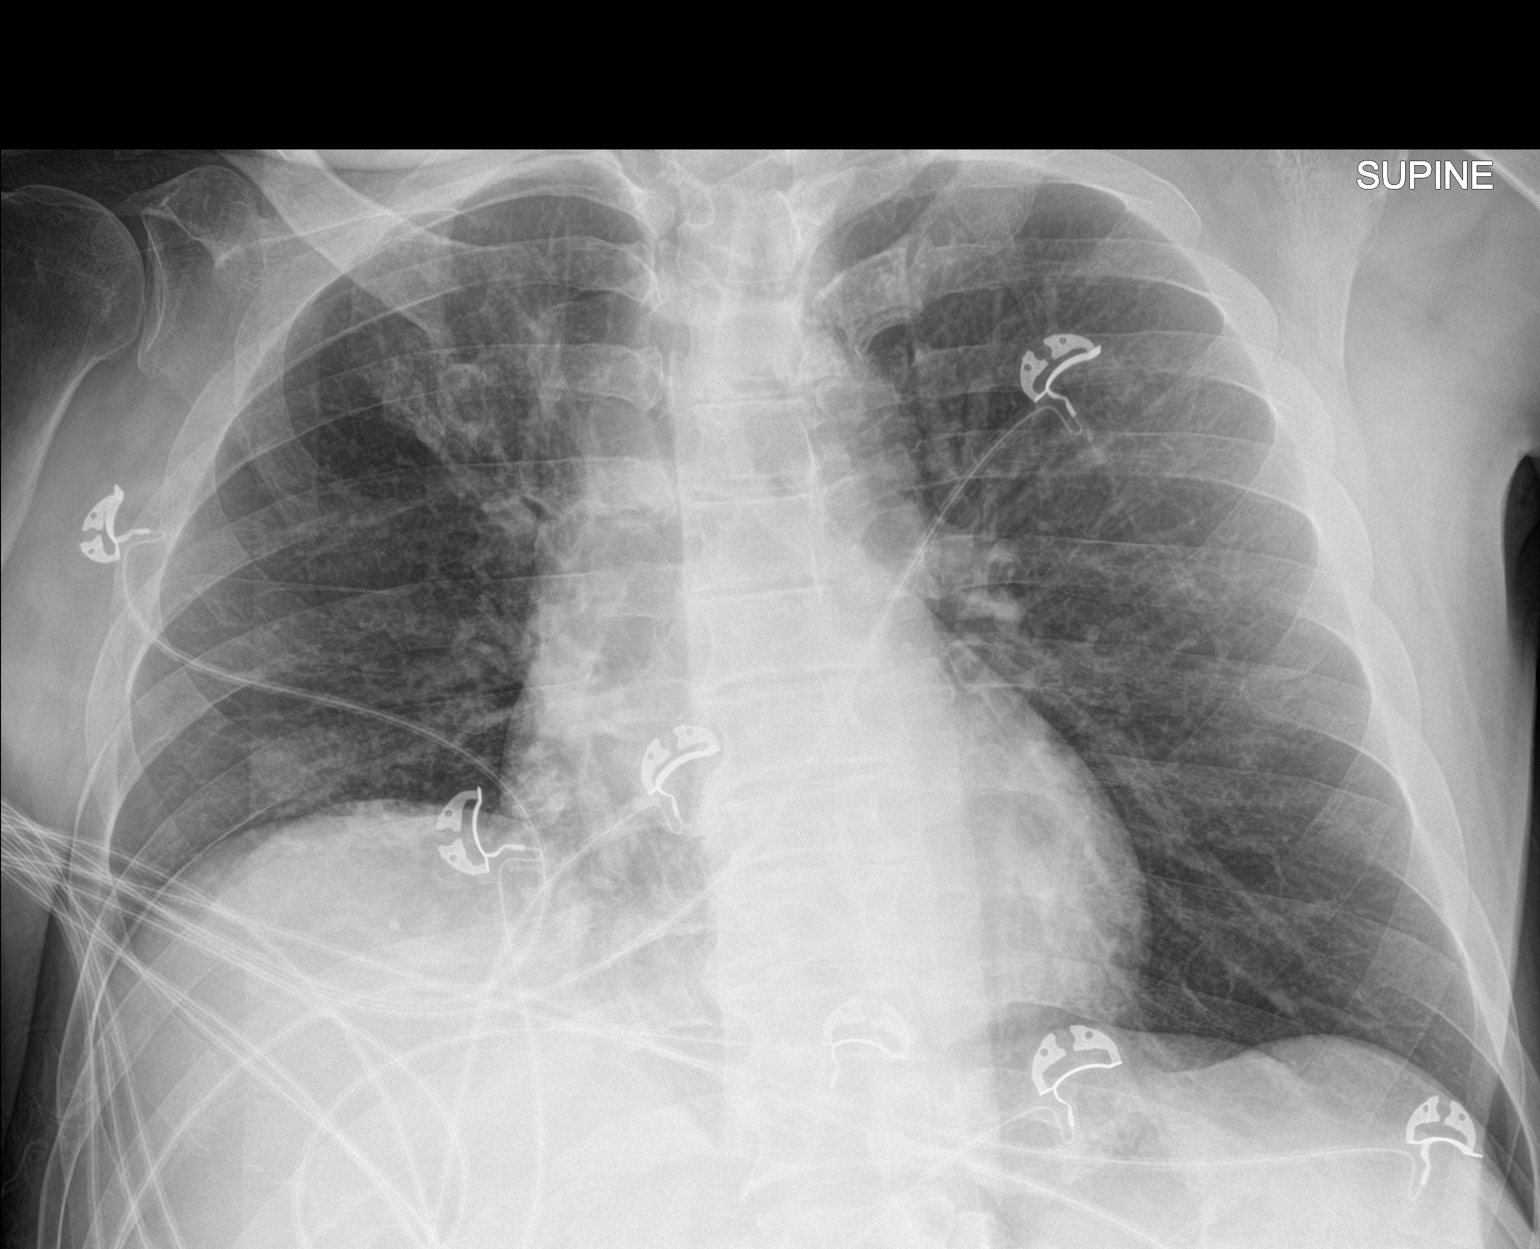

[1 of 1 positions shown; findings below may reference images not displayed]

FINDINGS: Elevation of the right hemidiaphragm, similar to the prior study.
Ill-defined opacities and areas of interstitial prominence in the
medial aspect of the right upper lobe. Left lung is clear. No
pleural effusions. No pneumothorax. No evidence of pulmonary edema.
Heart size is normal. The patient is rotated to the right on today's
exam, resulting in distortion of the mediastinal contours and
reduced diagnostic sensitivity and specificity for mediastinal
pathology.
IMPRESSION: 1. Ill-defined opacity and interstitial prominence in the medial
aspect of the right upper lobe concerning for developing
bronchopneumonia. Followup PA and lateral chest X-ray is recommended
in 3-4 weeks following trial of antibiotic therapy to ensure
resolution and exclude underlying malignancy.
2. Chronic elevation of the right hemidiaphragm.

## 2023-10-15 ENCOUNTER — Ambulatory Visit
Admission: EM | Admit: 2023-10-15 | Discharge: 2023-10-15 | Disposition: A | Discharge status: Home / Self Care | Payer: Medicare Other | Attending: Internal Medicine | Admitting: Internal Medicine

## 2023-10-15 DIAGNOSIS — M79602 Pain in left arm: Secondary | ICD-10-CM

## 2023-10-15 DIAGNOSIS — S46212A Strain of muscle, fascia and tendon of other parts of biceps, left arm, initial encounter: Secondary | ICD-10-CM

## 2023-10-15 NOTE — Discharge Instructions (Signed)
Monitor symptoms closely today.  You may use ibuprofen or Tylenol and warm compresses to the arm.  If symptoms do not improve or worsen please go to the emergency room ASAP for further workup/treatment.  Follow-up with your PCP in 2 days for recheck.  I hope he feels better soon!

## 2023-10-15 NOTE — ED Triage Notes (Signed)
Pt presents with caregiver.  Reports pt c/o his lt arm hurting this morning. States he seemed to have sharp pain.

## 2023-10-15 NOTE — ED Provider Notes (Addendum)
[facility]    CSN: [id number] Arrival date & time: 10/15/23  1020      History   Chief Complaint Chief Complaint  Patient presents with   Arm Pain         HPI Marvin Mahoney is a 62 y.o. male with a past medical history of cerebral palsy, CVA, intellectual disability is brought in by his caregiver for evaluation of arm pain.  Caregiver reports this morning while they were getting ready he was complaining of left arm pain.  Patient is unable to provide any additional information but caregivers deny any injury or known inciting event.  On the way to his day program caregiver noted he was tensing up and pain for several seconds and then would relax.  States on his way to the clinic and since being here he has not shown any signs of pain.  Caregivers concerned for a blood clot in the arm or heart attack.  He was previously on Xarelto due to a stroke but caregiver states he no longer takes this.  Patient denies any chest pain, shortness of breath and points to his bicep area when asked about where his pain is.  No OTC treatments have been used for symptoms.  No other concerns at this time.  Arm Pain    Past Medical History:  Diagnosis Date   Cerebral palsy (HCC)    Hyperprolactinemia (HCC)    Hypertension    Mental retardation    Stroke [facility])     Patient Active Problem List   Diagnosis Date Noted   CP (cerebral palsy) (HCC) 07/29/2021   Intellectual disability 07/29/2021   Intertrochanteric fracture of right hip (HCC) 07/29/2021   Fracture of femoral neck, right (HCC) 07/29/2021   Femoral neck fracture (HCC) 07/29/2021   Adjustment disorder with depressed mood 07/13/2021   GAD (generalized anxiety disorder) 07/13/2021   Insomnia 07/13/2021   Dementia (HCC) 03/08/2021   Hypertension 02/26/2017   Moderate protein-calorie malnutrition (HCC) 02/26/2017    Past Surgical History:  Procedure Laterality Date   NO PAST SURGERIES     unknown   TOTAL HIP ARTHROPLASTY  Right 08/01/2021   Procedure: RIGHT HIP HEMI  ARTHROPLASTY ANTERIOR APPROACH;  Surgeon: Tarry Kos, MD;  Location: [facility] OR;  Service: Orthopedics;  Laterality: Right;       Home Medications    Prior to Admission medications   Medication Sig Start Date End Date Taking? Authorizing Provider  alendronate (FOSAMAX) 70 MG tablet Take 70 mg by mouth once a week. 02/18/22   [provider]  PARoxetine (PAXIL) 20 MG tablet Take 60 mg by mouth at bedtime.     [provider]  risperiDONE (RISPERDAL M-TABS) 2 MG disintegrating tablet Take 1 tablet (2 mg total) by mouth at bedtime. 08/03/21 09/02/21  Eliezer Bottom, MD  rivaroxaban (XARELTO) 10 MG TABS tablet Take 1 tablet (10 mg total) by mouth daily for 28 days. 08/04/21 09/01/21  Eliezer Bottom, MD  tamsulosin (FLOMAX) 0.4 MG CAPS capsule Take 0.4 mg by mouth in the morning.    [provider]    Family History Family History  Family history unknown: Yes    Social History Social History   Tobacco Use   Smoking status: Former   Smokeless tobacco: Never  Advertising account planner   Vaping status: Never Used  Substance Use Topics   Alcohol use: No   Drug use: Never     Allergies   Patient has no known allergies.  Review of Systems Review of Systems  Musculoskeletal:        Left arm pain     Physical Exam Triage Vital Signs ED Triage Vitals [10/15/23 1049]  Encounter Vitals Group     BP 136/89     Systolic BP Percentile      Diastolic BP Percentile      Pulse Rate 96     Resp 16     Temp      Temp Source Temporal     SpO2 95 %     Weight      Height      Head Circumference      Peak Flow      Pain Score      Pain Loc      Pain Education      Exclude from Growth Chart    No data found.  Updated Vital Signs BP 136/89 (BP Location: Left Arm)   Pulse 96   Resp 16   SpO2 95%   Visual Acuity Right Eye Distance:   Left Eye Distance:   Bilateral Distance:    Right Eye Near:   Left Eye Near:     Bilateral Near:     Physical Exam Vitals and nursing note reviewed.  Constitutional:      General: He is not in acute distress.    Appearance: Normal appearance. He is not ill-appearing.  HENT:     Head: Normocephalic and atraumatic.  Eyes:     Pupils: Pupils are equal, round, and reactive to light.  Cardiovascular:     Rate and Rhythm: Normal rate and regular rhythm.     Heart sounds: Normal heart sounds.  Pulmonary:     Effort: Pulmonary effort is normal.     Breath sounds: Normal breath sounds.  Musculoskeletal:       Arms:     Comments: Patient endorses tenderness with palpation to the mid to distal right bicep.  No Popeye arm.  There is no swelling, erythema, warmth, ecchymosis.  There is no tenderness to elbow, forearm, wrist, or shoulder.  Pain with flexion or extension of the arm.  Unable to compare strength/circumference as right arm is atrophied.  Skin:    General: Skin is warm and dry.  Neurological:     Mental Status: He is alert and oriented to person, place, and time. Mental status is at baseline.  Psychiatric:     Comments: At baseline      UC Treatments / Results  Labs (all labs ordered are listed, but only abnormal results are displayed) Labs Reviewed - No data to display  EKG   Radiology No results found.  Procedures ED EKG  Date/Time: 10/15/2023 11:23 AM  Performed by: Radford Pax, NP Authorized by: Radford Pax, NP   ECG interpreted by ED Physician in the absence of a cardiologist: no   Previous ECG:    Previous ECG:  Unavailable Interpretation:    Interpretation: normal   Rate:    ECG rate:  84   ECG rate assessment: normal   Rhythm:    Rhythm: sinus rhythm   Ectopy:    Ectopy: none   QRS:    QRS axis:  Normal ST segments:    ST segments:  Normal T waves:    T waves: normal   Q waves:    Abnormal Q-waves: not present    (including critical care time)  Medications Ordered in UC Medications - No data to display  Initial  Impression / Assessment and Plan / UC Course  I have reviewed the triage vital signs and the nursing notes.  Pertinent labs & imaging results that were available during my care of the patient were reviewed by me and considered in my medical decision making (see chart for details).     I reviewed symptoms, exam, and concern with caregiver.  EKG unremarkable.  Patient denies any chest pain or shortness of breath.  Exam with tenderness to left bicep without any erythema, swelling, warmth.  Unable to compare strength/circumference as right arm is atrophied.  Discussed limitations and abilities of urgent care.  Advised I have a low suspicion for blood clot given presentation and exam but if this is the caregivers primary concern I recommend they would go to the ER for imaging.  Caregiver declined.  Possible bicep strain.  Advised to do trial of Tylenol or ibuprofen and monitor symptoms closely today if they do not improve and or worsen, red flags reviewed, she is to take him to the emergency room ASAP and caregiver verbalized understanding.  Advised PCP follow-up 2 days for recheck. Final Clinical Impressions(s) / UC Diagnoses   Final diagnoses:  Strain of left biceps, initial encounter  Left arm pain     Discharge Instructions      Monitor symptoms closely today.  You may use ibuprofen or Tylenol and warm compresses to the arm.  If symptoms do not improve or worsen please go to the emergency room ASAP for further workup/treatment.  Follow-up with your PCP in 2 days for recheck.  I hope he feels better soon!     ED Prescriptions   None    PDMP not reviewed this encounter.   Radford Pax, NP 10/15/23 1135    Radford Pax, NP 10/15/23 1135    Radford Pax, NP 10/15/23 1136

## 2024-08-11 ENCOUNTER — Ambulatory Visit (INDEPENDENT_AMBULATORY_CARE_PROVIDER_SITE_OTHER)

## 2024-08-11 ENCOUNTER — Ambulatory Visit
Admission: EM | Admit: 2024-08-11 | Discharge: 2024-08-11 | Disposition: A | Discharge status: Home / Self Care | Attending: Family Medicine | Admitting: Family Medicine

## 2024-08-11 DIAGNOSIS — M25562 Pain in left knee: Secondary | ICD-10-CM

## 2024-08-11 DIAGNOSIS — M1712 Unilateral primary osteoarthritis, left knee: Secondary | ICD-10-CM | POA: Diagnosis not present

## 2024-08-11 MED ORDER — IBUPROFEN 800 MG PO TABS: 800.0000 mg | ORAL_TABLET | Freq: Three times a day (TID) | ORAL | 0 refills | Status: AC

## 2024-08-11 NOTE — ED Provider Notes (Signed)
 [facility]  Note:  This document was prepared using Conservation officer, historic buildings and may include unintentional dictation errors.  MRN: [id number] DOB: 05-15-1961  Subjective:   Marvin Mahoney is a 63 y.o. male presenting for 3-day history of persistent left knee pain with slight swelling.  No known fall, trauma, injury.  No history of gout.  No known musculoskeletal disorders.  No clotting disorder.  Patient has been walking and bearing weight but endorses pain when he does walk.  No medications have been used for the patient's pain.  No current facility-administered medications for this encounter.  Current Outpatient Medications:    amantadine (SYMMETREL) 100 MG capsule, Indications: extrapyramidal disease., Disp: , Rfl:    Cholecalciferol 125 MCG (5000 UT) TABS, Take 5,000 Units by mouth., Disp: , Rfl:    Cyanocobalamin 2500 MCG SUBL, PLACE 1 TABLET UNDER THE TONGUE DAILY. (PINK ROUND TABLET), Disp: , Rfl:    omeprazole (PRILOSEC) 40 MG capsule, Take 40 mg by mouth daily., Disp: , Rfl:    solifenacin (VESICARE) 5 MG tablet, Take 5 mg by mouth daily., Disp: , Rfl:    Vitamin D , Ergocalciferol , (DRISDOL ) 1.25 MG (50000 UNIT) CAPS capsule, GIVE 1 CAPSULE BY MOUTH ONE TIME A DAY EVERY 7 DAYS FOR OSTEOPOROSIS. (GREEN GEL CAPSULE) (SUNDAYS), Disp: , Rfl:    alendronate  (FOSAMAX ) 70 MG tablet, Take 70 mg by mouth once a week., Disp: , Rfl:    PARoxetine  (PAXIL ) 20 MG tablet, Take 60 mg by mouth at bedtime. , Disp: , Rfl:    risperiDONE  (RISPERDAL  M-TABS) 2 MG disintegrating tablet, Take 1 tablet (2 mg total) by mouth at bedtime., Disp: 30 tablet, Rfl: 0   rivaroxaban  (XARELTO ) 10 MG TABS tablet, Take 1 tablet (10 mg total) by mouth daily for 28 days., Disp: 28 tablet, Rfl: 0   tamsulosin  (FLOMAX ) 0.4 MG CAPS capsule, Take 0.4 mg by mouth in the morning., Disp: , Rfl:    No Known Allergies  Past Medical History:  Diagnosis Date   Cerebral palsy (HCC)     Hyperprolactinemia (HCC)    Hypertension    Mental retardation    Stroke [facility])      Past Surgical History:  Procedure Laterality Date   NO PAST SURGERIES     unknown   TOTAL HIP ARTHROPLASTY Right 08/01/2021   Procedure: RIGHT HIP HEMI  ARTHROPLASTY ANTERIOR APPROACH;  Surgeon: Jerri Kay HERO, MD;  Location: [facility] OR;  Service: Orthopedics;  Laterality: Right;    Family History  Family history unknown: Yes    Social History   Tobacco Use   Smoking status: Never   Smokeless tobacco: Never  Vaping Use   Vaping status: Never Used  Substance Use Topics   Alcohol use: No   Drug use: Never    ROS   Objective:   Vitals: BP (!) 142/87 (BP Location: Left Arm)   Pulse 76   Temp 98.7 F (37.1 C) (Oral)   Resp 20   SpO2 97%   Physical Exam Constitutional:      General: He is not in acute distress.    Appearance: Normal appearance. He is well-developed and normal weight. He is not ill-appearing, toxic-appearing or diaphoretic.  HENT:     Head: Normocephalic and atraumatic.     Right Ear: External ear normal.     Left Ear: External ear normal.     Nose: Nose normal.     Mouth/Throat:     Pharynx: Oropharynx is  clear.  Eyes:     General: No scleral icterus.       Right eye: No discharge.        Left eye: No discharge.     Extraocular Movements: Extraocular movements intact.  Cardiovascular:     Rate and Rhythm: Normal rate.  Pulmonary:     Effort: Pulmonary effort is normal.  Musculoskeletal:     Cervical back: Normal range of motion.     Left knee: Swelling present. No deformity, effusion, erythema, ecchymosis, lacerations, bony tenderness or crepitus. Normal range of motion. Tenderness present over the patellar tendon. No medial joint line or lateral joint line tenderness. Normal alignment and normal patellar mobility.  Neurological:     Mental Status: He is alert and oriented to person, place, and time.  Psychiatric:        Mood and Affect: Mood normal.         Behavior: Behavior normal.        Thought Content: Thought content normal.        Judgment: Judgment normal.    DG Knee Complete 4 Views Left Result Date: 08/11/2024 CLINICAL DATA:  Left knee pain. EXAM: LEFT KNEE - COMPLETE 4+ VIEW COMPARISON:  None Available. FINDINGS: No evidence of acute fracture or dislocation. Trace joint effusion. Mild-to-moderate tricompartmental osteoarthritis with joint space narrowing and osteophytosis most pronounced in the medial femorotibial and patellofemoral compartments. Soft tissues are unremarkable. IMPRESSION: 1. No acute osseous abnormality. 2. Mild-to-moderate tricompartmental osteoarthritis most pronounced in the medial and patellofemoral compartments with trace joint effusion. Electronically Signed   By: Harrietta Sherry M.D.   On: 08/11/2024 13:44   Applied a 4 inch Ace wrap to the left knee.  Assessment and Plan :   PDMP not reviewed this encounter.  1. Osteoarthritis of left knee, unspecified osteoarthritis type   2. Acute pain of left knee    Patient has a medical history of dementia, intellectual disability, anxiety, insomnia and therefore recommend avoiding the use of steroids.  He would benefit from general RICE method, ibuprofen  for pain and inflammation.  Advise follow-up with an orthopedist for consideration of local injection therapy or more testing as deemed appropriate such as an MRI.  Counseled patient on potential for adverse effects with medications prescribed/recommended today, ER and return-to-clinic precautions discussed, patient verbalized understanding.    Marvin Mahoney, [place] 08/11/24 [phone]

## 2024-08-11 NOTE — ED Triage Notes (Addendum)
 Per pt and caretaker pt c/o left knee pain x 3 days-denies known injury-no pain meds PTA-NAD-steady limping gait

## 2024-08-11 NOTE — Discharge Instructions (Addendum)
 The x-ray demonstrated that you have arthritis of the left knee and inner joint swelling, a small joint effusion. Start using ibuprofen  every 8 hours with food for the arthritic left knee pain. Use the Ace wrap during the day if you will be walking and active. Do not wear it at night. Follow up with an orthopedist for more local treatment of the knee. They can also pursue more testing such as an MRI to test for the meniscus, ligaments and general soft tissue.
